# Patient Record
Sex: Female | Born: 1955 | Race: Black or African American | Hispanic: No | Smoking: Never smoker
Health system: Southern US, Community
[De-identification: ages and names within clinical notes are randomized; demographics above are authoritative.]

## PROBLEM LIST (undated history)

## (undated) DIAGNOSIS — I10 Essential (primary) hypertension: Secondary | ICD-10-CM

## (undated) DIAGNOSIS — IMO0002 Reserved for concepts with insufficient information to code with codable children: Secondary | ICD-10-CM

## (undated) DIAGNOSIS — M329 Systemic lupus erythematosus, unspecified: Secondary | ICD-10-CM

## (undated) DIAGNOSIS — M069 Rheumatoid arthritis, unspecified: Secondary | ICD-10-CM

## (undated) DIAGNOSIS — I38 Endocarditis, valve unspecified: Secondary | ICD-10-CM

## (undated) DIAGNOSIS — E7800 Pure hypercholesterolemia, unspecified: Secondary | ICD-10-CM

## (undated) DIAGNOSIS — F329 Major depressive disorder, single episode, unspecified: Secondary | ICD-10-CM

## (undated) DIAGNOSIS — R569 Unspecified convulsions: Secondary | ICD-10-CM

## (undated) DIAGNOSIS — F32A Depression, unspecified: Secondary | ICD-10-CM

## (undated) HISTORY — DX: Unspecified convulsions: R56.9

## (undated) HISTORY — DX: Systemic lupus erythematosus, unspecified: M32.9

## (undated) HISTORY — DX: Major depressive disorder, single episode, unspecified: F32.9

## (undated) HISTORY — DX: Depression, unspecified: F32.A

## (undated) HISTORY — PX: CHOLECYSTECTOMY: SHX55

## (undated) HISTORY — PX: BREAST BIOPSY: SHX20

## (undated) HISTORY — DX: Pure hypercholesterolemia, unspecified: E78.00

## (undated) HISTORY — DX: Rheumatoid arthritis, unspecified: M06.9

## (undated) HISTORY — DX: Essential (primary) hypertension: I10

## (undated) HISTORY — DX: Endocarditis, valve unspecified: I38

## (undated) HISTORY — DX: Reserved for concepts with insufficient information to code with codable children: IMO0002

---

## 2015-06-27 ENCOUNTER — Other Ambulatory Visit: Payer: Self-pay | Admitting: Family Medicine

## 2015-06-27 DIAGNOSIS — R109 Unspecified abdominal pain: Secondary | ICD-10-CM

## 2015-07-04 ENCOUNTER — Telehealth (HOSPITAL_COMMUNITY): Payer: Self-pay | Admitting: *Deleted

## 2015-07-09 ENCOUNTER — Telehealth (HOSPITAL_COMMUNITY): Payer: Self-pay | Admitting: *Deleted

## 2015-07-17 ENCOUNTER — Ambulatory Visit
Admission: RE | Admit: 2015-07-17 | Discharge: 2015-07-17 | Disposition: A | Discharge status: Home / Self Care | Payer: Medicare Other | Source: Ambulatory Visit | Attending: Family Medicine | Admitting: Family Medicine

## 2015-07-17 DIAGNOSIS — R109 Unspecified abdominal pain: Secondary | ICD-10-CM

## 2015-07-17 MED ORDER — IOPAMIDOL (ISOVUE-300) INJECTION 61%
100.0000 mL | Freq: Once | INTRAVENOUS | Status: AC | PRN
  Administered 2015-07-17: 100 mL via INTRAVENOUS

## 2015-07-23 ENCOUNTER — Other Ambulatory Visit: Payer: Self-pay | Admitting: Family Medicine

## 2015-07-24 ENCOUNTER — Other Ambulatory Visit: Payer: Self-pay | Admitting: Family Medicine

## 2015-07-24 DIAGNOSIS — K869 Disease of pancreas, unspecified: Secondary | ICD-10-CM

## 2015-07-24 DIAGNOSIS — K769 Liver disease, unspecified: Secondary | ICD-10-CM

## 2015-07-25 ENCOUNTER — Other Ambulatory Visit (HOSPITAL_COMMUNITY): Payer: Self-pay | Admitting: Family Medicine

## 2015-07-25 DIAGNOSIS — Z8249 Family history of ischemic heart disease and other diseases of the circulatory system: Secondary | ICD-10-CM

## 2015-07-30 ENCOUNTER — Ambulatory Visit: Payer: Self-pay | Admitting: Neurology

## 2015-08-01 ENCOUNTER — Other Ambulatory Visit: Payer: Self-pay | Admitting: Physician Assistant

## 2015-08-01 DIAGNOSIS — Z7952 Long term (current) use of systemic steroids: Secondary | ICD-10-CM

## 2015-08-05 ENCOUNTER — Ambulatory Visit
Admission: RE | Admit: 2015-08-05 | Discharge: 2015-08-05 | Disposition: A | Discharge status: Home / Self Care | Payer: Medicare Other | Source: Ambulatory Visit | Attending: Family Medicine | Admitting: Family Medicine

## 2015-08-05 DIAGNOSIS — K869 Disease of pancreas, unspecified: Secondary | ICD-10-CM

## 2015-08-05 DIAGNOSIS — K769 Liver disease, unspecified: Secondary | ICD-10-CM

## 2015-08-08 ENCOUNTER — Ambulatory Visit (HOSPITAL_COMMUNITY): Payer: PPO | Attending: Cardiology

## 2015-08-08 ENCOUNTER — Other Ambulatory Visit: Payer: Self-pay

## 2015-08-08 DIAGNOSIS — Z8249 Family history of ischemic heart disease and other diseases of the circulatory system: Secondary | ICD-10-CM | POA: Diagnosis not present

## 2015-08-08 DIAGNOSIS — I340 Nonrheumatic mitral (valve) insufficiency: Secondary | ICD-10-CM | POA: Insufficient documentation

## 2015-08-08 DIAGNOSIS — E785 Hyperlipidemia, unspecified: Secondary | ICD-10-CM | POA: Diagnosis not present

## 2015-08-08 DIAGNOSIS — I119 Hypertensive heart disease without heart failure: Secondary | ICD-10-CM | POA: Insufficient documentation

## 2015-08-13 ENCOUNTER — Ambulatory Visit
Admission: RE | Admit: 2015-08-13 | Discharge: 2015-08-13 | Disposition: A | Discharge status: Home / Self Care | Payer: PPO | Source: Ambulatory Visit | Attending: Physician Assistant | Admitting: Physician Assistant

## 2015-08-13 DIAGNOSIS — Z780 Asymptomatic menopausal state: Secondary | ICD-10-CM | POA: Diagnosis not present

## 2015-08-13 DIAGNOSIS — Z7952 Long term (current) use of systemic steroids: Secondary | ICD-10-CM

## 2015-08-19 ENCOUNTER — Ambulatory Visit (INDEPENDENT_AMBULATORY_CARE_PROVIDER_SITE_OTHER): Payer: PPO | Admitting: Neurology

## 2015-08-19 ENCOUNTER — Encounter: Payer: Self-pay | Admitting: Neurology

## 2015-08-19 VITALS — BP 110/70 | HR 70 | Ht 62.0 in | Wt 159.0 lb

## 2015-08-19 DIAGNOSIS — R413 Other amnesia: Secondary | ICD-10-CM

## 2015-08-19 DIAGNOSIS — G40009 Localization-related (focal) (partial) idiopathic epilepsy and epileptic syndromes with seizures of localized onset, not intractable, without status epilepticus: Secondary | ICD-10-CM

## 2015-08-19 NOTE — Progress Notes (Signed)
NEUROLOGY CONSULTATION NOTE  Kerri Mccoy MRN: [id number] DOB: May 26, 1956  Referring provider: Dr. Maurice Small Primary care provider: Dr. Maurice Small  Reason for consult:  Seizures, memory loss  Dear Dr Justin Mend:  Thank you for your kind referral of Kerri Mccoy for consultation of the above symptoms. Although her history is well known to you, please allow me to reiterate it for the purpose of our medical record. The patient was accompanied to the clinic by her nephew who also provides collateral information. Records and images were personally reviewed where available.  HISTORY OF PRESENT ILLNESS: This is a pleasant 60 year old right-handed woman with a history of discoid lupus, rheumatoid arthritis, hyperlipidemia, hypertension, presenting to establish care for seizures and memory loss. She reports seizures started in 1969 when she was diagnosed with lupus. She denies any convulsive seizures. She describes her seizures as "phasing out," she would start feeling really strange, like something is traveling up her body, then she becomes unresponsive and stares. She needs to take a nap after, no focal weakness. She denies any olfactory/gustatory hallucinations or focal paresthesias. No tongue bite or incontinence. She was started on an unrecalled seizure medication in 1972, and reports being switched to oxcarbazepine around 2 years ago. She felt drowsy on 300mg  BID and takes on 300mg  qhs. 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 Once she stopped working, she reports the seizures have decreased in frequency, she can go several months without seizures, last seizure was 3 months ago. She had been seeing neurologist Dr. Posey Pronto in [place], and previously another neurologist who told her that her EEG had shown seizure activity and switched her to oxcarbazepine. She reports having a brain scan, results unavailable for review. She started  having body jerking around 1-1/2 yrs ago that mostly occurs when she gets surprised. There was significant improvement with starting Paroxetine by Dr. Posey Pronto almost a year ago. She had a few body jerks in the office today, initially more at the beginning and towards the end of the visit, none when distracted.   She started having memory changes a couple of years ago. She feels her memory is "pretty messed up," she would ask the same question repeatedly, would not recall where she put things or what she wore yesterday. She occasionally forgets her medications. All her bills are on autopay. She does not drive. Her nephew feels her memory is better than what people say it is, he does notice she repeats herself. He denies any personality changes, and reports she is much better overall since moving to [place] last October 2016. She was prescribed donepezil 10mg  daily by Dr. Posey Pronto, no side effects.  She has occasional dizziness (lightheaded) that resolves upon sitting. She has some neck pain due to degenerative disc disease. She has occasional numbness in her fingers. She denies any headaches, diplopia, dysarthria, dysphagia, back pain, focal numbness/tingling/weakness, bowel/bladder dysfunction. Her nephew reports an incident 2 months ago where "she just freaked and started screaming," he is unsure if this was a seizure but he denies any staring/unresponsive episodes.   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 There is no history of febrile convulsions, CNS infections such as meningitis/encephalitis, significant traumatic brain injury, neurosurgical procedures, or family history of seizures.  PAST MEDICAL HISTORY: Past Medical History  Diagnosis Date  . Hypertension   . Hypercholesteremia   .  Seizure ([place])   . Depression   . Lupus ([place])   . Rheumatoid arthritis ([place])   . Heart valve disorder     PAST SURGICAL HISTORY: Past  Surgical History  Procedure Laterality Date  . Cesarean section      x 2  . Cholecystectomy    . Breast biopsy Right     MEDICATIONS: No current outpatient prescriptions on file prior to visit.   No current facility-administered medications on file prior to visit.    ALLERGIES: Allergies  Allergen Reactions  . Codeine Rash  . Penicillins Rash    FAMILY HISTORY: No family history on file.  SOCIAL HISTORY: Social History   Social History  . Marital Status: Divorced    Spouse Name: N/A  . Number of Children: N/A  . Years of Education: N/A   Occupational History  . Not on file.   Social History Main Topics  . Smoking status: Never Smoker   . Smokeless tobacco: Not on file  . Alcohol Use: 0.0 oz/week    0 Standard drinks or equivalent per week     Comment: once every 4 months  . Drug Use: No  . Sexual Activity: Not on file   Other Topics Concern  . Not on file   Social History Narrative  . No narrative on file    REVIEW OF SYSTEMS: Constitutional: No fevers, chills, or sweats, no generalized fatigue, change in appetite Eyes: No visual changes, double vision, eye pain Ear, nose and throat: No hearing loss, ear pain, nasal congestion, sore throat Cardiovascular: No chest pain, palpitations Respiratory:  No shortness of breath at rest or with exertion, wheezes GastrointestinaI: No nausea, vomiting, diarrhea, abdominal pain, fecal incontinence Genitourinary:  No dysuria, urinary retention or frequency Musculoskeletal:  +neck pain,no back pain Integumentary: No rash, pruritus, skin lesions Neurological: as above Psychiatric: No depression, insomnia, anxiety Endocrine: No palpitations, fatigue, diaphoresis, mood swings, change in appetite, change in weight, increased thirst Hematologic/Lymphatic:  No anemia, purpura, petechiae. Allergic/Immunologic: no itchy/runny eyes, nasal congestion, recent allergic reactions, rashes  PHYSICAL EXAM: Filed Vitals:    08/19/15 0846  BP: 110/70  Pulse: 70   General: No acute distress Head:  Normocephalic/atraumatic Eyes: Fundoscopic exam shows bilateral sharp discs, no vessel changes, exudates, or hemorrhages Neck: supple, no paraspinal tenderness, full range of motion Back: No paraspinal tenderness Heart: regular rate and rhythm Lungs: Clear to auscultation bilaterally. Vascular: No carotid bruits. Skin/Extremities: No rash, no edema Neurological Exam: Mental status: alert and oriented to person, place, and time, no dysarthria or aphasia, Fund of knowledge is appropriate.  Recent and remote memory are intact.  Attention and concentration are normal.    Able to name objects and repeat phrases. Cranial nerves: CN I: not tested CN II: pupils equal, round and reactive to light, visual fields intact, fundi unremarkable. CN III, IV, VI:  full range of motion, no nystagmus, no ptosis CN V: facial sensation intact CN VII: upper and lower face symmetric CN VIII: hearing intact to finger rub CN IX, X: gag intact, uvula midline CN XI: sternocleidomastoid and trapezius muscles intact CN XII: tongue midline Bulk & Tone: normal, no fasciculations. Motor: 5/5 throughout with no pronator drift. Sensation: intact to light touch, cold, pin, vibration and joint position sense.  No extinction to double simultaneous stimulation.  Romberg test negative Deep Tendon Reflexes: +2 throughout, no ankle clonus Plantar responses: downgoing bilaterally Cerebellar: no incoordination on finger to nose, heel to shin. 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 She continues to have seizures every few months, but is on a low dose of  oxcarbazepine 300mg  qhs. Records from her previous neurologist will be requested for review. Continue current dose for now, a 24-hour EEG will be ordered to further classify her seizures. She is noted to have body jerking in the office, that was distractible, and seemed to occur more with startle. She also reports memory loss and has been prescribed donepezil. The 24-hour EEG will be helpful to assess for subclinical seizures that can also potentially cause memory issues.  driving laws were discussed with the patient, and she knows to stop driving after a seizure, until 6 months seizure-free. She does not drive. She will follow-up in 6 months and knows to call for any changes.   Thank you for allowing me to participate in the care of this patient. Please do not hesitate to call for any questions or concerns.   Ellouise Newer, M.D.  CC: Dr. Justin Mend

## 2015-08-19 NOTE — Patient Instructions (Addendum)
1. Schedule 24-hour EEG 2. Continue oxcarbazepine 300mg  daily 3. Continue donepezil 10mg  daily 4. Records from your previous neurologist will be requested for review ((Dr. Posey Pronto, [phone], [address] [place], [place]) 5. Follow-up in 6 months  Seizure Precautions: 1. If medication has been prescribed for you to prevent seizures, take it exactly as directed.  Do not stop taking the medicine without talking to your doctor first, even if you have not had a seizure in a long time.   2. Avoid activities in which a seizure would cause danger to yourself or to others.  Don't operate dangerous machinery, swim alone, or climb in high or dangerous places, such as on ladders, roofs, or girders.  Do not drive unless your doctor says you may.  3. If you have any warning that you may have a seizure, lay down in a safe place where you can't hurt yourself.    4.  No driving for 6 months from last seizure, as per [facility].   Please refer to the following link on the [place] website for more information: http://[email or web address]/[email or web address]/driving/drivingu.cfm   5.  Maintain good sleep hygiene. Avoid alcohol.  6.  Contact your doctor if you have any problems that may be related to the medicine you are taking.  7.  Call 911 and bring the patient back to the ED if:        A.  The seizure lasts longer than 5 minutes.       B.  The patient doesn't awaken shortly after the seizure  C.  The patient has new problems such as difficulty seeing, speaking or moving  D.  The patient was injured during the seizure  E.  The patient has a temperature over 102 F (39C)  F.  The patient vomited and now is having trouble breathing

## 2015-08-26 ENCOUNTER — Ambulatory Visit (INDEPENDENT_AMBULATORY_CARE_PROVIDER_SITE_OTHER): Payer: PPO | Admitting: Neurology

## 2015-08-26 DIAGNOSIS — G40009 Localization-related (focal) (partial) idiopathic epilepsy and epileptic syndromes with seizures of localized onset, not intractable, without status epilepticus: Secondary | ICD-10-CM | POA: Diagnosis not present

## 2015-08-29 DIAGNOSIS — Z79899 Other long term (current) drug therapy: Secondary | ICD-10-CM | POA: Diagnosis not present

## 2015-08-29 DIAGNOSIS — M329 Systemic lupus erythematosus, unspecified: Secondary | ICD-10-CM | POA: Diagnosis not present

## 2015-09-02 ENCOUNTER — Encounter: Payer: Self-pay | Admitting: Neurology

## 2015-09-02 ENCOUNTER — Ambulatory Visit (INDEPENDENT_AMBULATORY_CARE_PROVIDER_SITE_OTHER): Payer: PPO | Admitting: Neurology

## 2015-09-02 VITALS — BP 118/76 | HR 71 | Resp 16 | Ht 62.0 in | Wt 159.0 lb

## 2015-09-02 DIAGNOSIS — G40009 Localization-related (focal) (partial) idiopathic epilepsy and epileptic syndromes with seizures of localized onset, not intractable, without status epilepticus: Secondary | ICD-10-CM

## 2015-09-02 DIAGNOSIS — R413 Other amnesia: Secondary | ICD-10-CM

## 2015-09-02 NOTE — Procedures (Signed)
ELECTROENCEPHALOGRAM REPORT  Dates of Recording: 08/26/2015 to 08/27/2015  Patient's Name: Kerri Mccoy MRN: [id number] Date of Birth: [birth year]  Referring Provider: Dr. 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 She continues to have seizures every few months, as well as memory loss. EEG for classification and assess for subclinical seizures causing memory issues.   Medications: Trileptal, Donepezil, Paxil, Trazodone, Prednisone, Plaquenil  Technical Summary: This is a 24-hour multichannel digital EEG recording measured by the international 10-20 system with electrodes applied with paste and impedances below 5000 ohms performed as portable with EKG monitoring.  The digital EEG was referentially recorded, reformatted, and digitally filtered in a variety of bipolar and referential montages for optimal display.    DESCRIPTION OF RECORDING: During maximal wakefulness, the background activity consisted of a symmetric 9.5 Hz posterior dominant rhythm which was reactive to eye opening.  There were no epileptiform discharges or focal slowing seen in wakefulness.  During the recording, the patient progresses through wakefulness, drowsiness, and Stage 2 sleep.  Again, there were no epileptiform discharges seen.  Events: On 03/21/at 1137 hours, she reports a slight headache on the bottom right of her head. Electrographically, there were no EEG or EKG changes seen.  On 03/21 at 1158 hours, she reports a leg jerk. Electrographically, there were no EEG or EKG changes seen.  On 03/21 at 1225 hours, she reports a twitch. Electrographically, there were no EEG or EKG changes seen.  On 03/21 at 1853 hours, she reports a twitch. Electrographically, there were no EEG or EKG changes seen.  On 03/21 at 1941 hours, she reports a twitch. Electrographically, there were no EEG or EKG  changes seen.  There were no electrographic seizures seen.  EKG lead was unremarkable.  IMPRESSION: This 24-hour ambulatory EEG study is normal.    CLINICAL CORRELATION: A normal EEG does not exclude a clinical diagnosis of epilepsy. Typical events were not captured. Body twitches and leg jerk, headache, did not show any electrographic correlate. There were no electrographic seizures seen.  If further clinical questions remain, inpatient video EEG monitoring may be helpful.   Ellouise Newer, M.D.

## 2015-09-02 NOTE — Patient Instructions (Signed)
You look great! Continue all your medications. Follow-up in 6 months, call for any changes.  Seizure Precautions: 1. If medication has been prescribed for you to prevent seizures, take it exactly as directed.  Do not stop taking the medicine without talking to your doctor first, even if you have not had a seizure in a long time.   2. Avoid activities in which a seizure would cause danger to yourself or to others.  Don't operate dangerous machinery, swim alone, or climb in high or dangerous places, such as on ladders, roofs, or girders.  Do not drive unless your doctor says you may.  3. If you have any warning that you may have a seizure, lay down in a safe place where you can't hurt yourself.    4.  No driving for 6 months from last seizure, as per [facility].   Please refer to the following link on the [place] website for more information: http://[email or web address]/[email or web address]/driving/drivingu.cfm   5.  Maintain good sleep hygiene. Avoid alcohol.  6.  Contact your doctor if you have any problems that may be related to the medicine you are taking.  7.  Call 911 and bring the patient back to the ED if:        A.  The seizure lasts longer than 5 minutes.       B.  The patient doesn't awaken shortly after the seizure  C.  The patient has new problems such as difficulty seeing, speaking or moving  D.  The patient was injured during the seizure  E.  The patient has a temperature over 102 F (39C)  F.  The patient vomited and now is having trouble breathing

## 2015-09-02 NOTE — Progress Notes (Signed)
NEUROLOGY FOLLOW UP OFFICE NOTE  Kerri Mccoy [id number]  HISTORY OF PRESENT ILLNESS: I had the pleasure of seeing Kerri Mccoy in follow-up in the neurology clinic on 09/02/2015.  The patient was last seen 3 weeks ago to establish care for seizures and memory loss. She is again accompanied by her nephew who helps supplement the history today.  Records and images were personally reviewed where available.  Her 24-hour EEG was normal, she had several body twitches, leg jerk, headache, with no associated epileptiform correlate. She denies any new changes since her last visit. She reports she is not as "jumpy" today, and has been calm and relaxed since her move to [place].   HPI: This is a pleasant 60 yo RH woman with a history of discoid lupus, rheumatoid arthritis, hyperlipidemia, hypertension, with a history of seizures and memory loss. She reports seizures started in 1969 when she was diagnosed with lupus. She denies any convulsive seizures. She describes her seizures as "phasing out," she would start feeling really strange, like something is traveling up her body, then she becomes unresponsive and stares. She needs to take a nap after, no focal weakness. She denies any olfactory/gustatory hallucinations or focal paresthesias. No tongue bite or incontinence. She was started on an unrecalled seizure medication in 1972, and reports being switched to oxcarbazepine around 2 years ago. She felt drowsy on 300mg  BID and takes on 300mg  qhs. 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 Once she stopped working, she reports the seizures have decreased in frequency, she can go several months without seizures, last seizure was 3 months ago. She had been seeing neurologist Dr. Posey Pronto in [place], and previously another neurologist who told her that her EEG had shown seizure activity and switched her to oxcarbazepine. She reports having a brain  scan, results unavailable for review. She started having body jerking around 1-1/2 yrs ago that mostly occurs when she gets surprised. There was significant improvement with starting Paroxetine by Dr. Posey Pronto almost a year ago. She had a few body jerks in the office today, initially more at the beginning and towards the end of the visit, none when distracted.   She started having memory changes a couple of years ago. She feels her memory is "pretty messed up," she would ask the same question repeatedly, would not recall where she put things or what she wore yesterday. She occasionally forgets her medications. All her bills are on autopay. She does not drive. Her nephew feels her memory is better than what people say it is, he does notice she repeats herself. He denies any personality changes, and reports she is much better overall since moving to [place] last October 2016. She was prescribed donepezil 10mg  daily by Dr. Posey Pronto, no side effects.  She has occasional dizziness (lightheaded) that resolves upon sitting. She has some neck pain due to degenerative disc disease. She has occasional numbness in her fingers. She denies any headaches, diplopia, dysarthria, dysphagia, back pain, focal numbness/tingling/weakness, bowel/bladder dysfunction. Her nephew reports an incident 2 months ago where "she just freaked and started screaming," he is unsure if this was a seizure but he denies any staring/unresponsive episodes.   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 There is no history of febrile convulsions, CNS infections such as meningitis/encephalitis, significant traumatic brain injury, neurosurgical procedures, or family history of  seizures.  PAST MEDICAL HISTORY: Past Medical History  Diagnosis Date  . Hypertension   . Hypercholesteremia   . Seizure ([place])   . Depression   . Lupus ([place])   . Rheumatoid arthritis ([place])   . Heart valve  disorder     MEDICATIONS: Current Outpatient Prescriptions on File Prior to Visit  Medication Sig Dispense Refill  . benazepril (LOTENSIN) 20 MG tablet Take 20 mg by mouth daily.  3  . Cholecalciferol (VITAMIN D-3) 1000 units CAPS Take 1,000 Units by mouth daily.    Marland Kitchen donepezil (ARICEPT) 10 MG tablet Take 10 mg by mouth at bedtime.    . ferrous sulfate 325 (65 FE) MG tablet Take 325 mg by mouth daily with breakfast.    . hydrochlorothiazide (HYDRODIURIL) 25 MG tablet Take 25 mg by mouth daily.    . hydroxychloroquine (PLAQUENIL) 200 MG tablet Take 200 mg by mouth 2 (two) times daily with a meal.  0  . ibuprofen (ADVIL,MOTRIN) 800 MG tablet Take 800 mg by mouth as needed.    . Oxcarbazepine (TRILEPTAL) 300 MG tablet Take 300 mg by mouth 2 (two) times daily.   3  . PARoxetine (PAXIL) 10 MG tablet TAKE 1 TABLET IN THE MORNING ONCE A DAY ORALLY 30 DAYS  5  . predniSONE (DELTASONE) 5 MG tablet TAKE 1 TABLET ONCE A DAY ( LUPUS) ORALLY 30 DAYS  5  . simvastatin (ZOCOR) 20 MG tablet Take 20 mg by mouth every evening.  3  . traZODone (DESYREL) 50 MG tablet TAKE 1 TABLET AT BEDTIME AS NEEDED ONCE A DAY ORALLY 30 DAYS  5   No current facility-administered medications on file prior to visit.    ALLERGIES: Allergies  Allergen Reactions  . Codeine Rash  . Penicillins Rash    FAMILY HISTORY: No family history on file.  SOCIAL HISTORY: Social History   Social History  . Marital Status: Divorced    Spouse Name: N/A  . Number of Children: N/A  . Years of Education: N/A   Occupational History  . Not on file.   Social History Main Topics  . Smoking status: Never Smoker   . Smokeless tobacco: Not on file  . Alcohol Use: 0.0 oz/week    0 Standard drinks or equivalent per week     Comment: once every 4 months  . Drug Use: No  . Sexual Activity: Not on file   Other Topics Concern  . Not on file   Social History Narrative  . No narrative on file    REVIEW OF  SYSTEMS: Constitutional: No fevers, chills, or sweats, no generalized fatigue, change in appetite Eyes: No visual changes, double vision, eye pain Ear, nose and throat: No hearing loss, ear pain, nasal congestion, sore throat Cardiovascular: No chest pain, palpitations Respiratory:  No shortness of breath at rest or with exertion, wheezes GastrointestinaI: No nausea, vomiting, diarrhea, abdominal pain, fecal incontinence Genitourinary:  No dysuria, urinary retention or frequency Musculoskeletal:  No neck pain, back pain Integumentary: No rash, pruritus, skin lesions Neurological: as above Psychiatric: No depression, insomnia, anxiety Endocrine: No palpitations, fatigue, diaphoresis, mood swings, change in appetite, change in weight, increased thirst Hematologic/Lymphatic:  No anemia, purpura, petechiae. Allergic/Immunologic: no itchy/runny eyes, nasal congestion, recent allergic reactions, rashes  PHYSICAL EXAM: Filed Vitals:   09/02/15 0926  BP: 118/76  Pulse: 71  Resp: 16   General: No acute distress Head:  Normocephalic/atraumatic Neck: supple, no paraspinal tenderness, full range of motion Heart:  Regular  rate and rhythm Lungs:  Clear to auscultation bilaterally Back: No paraspinal tenderness Skin/Extremities: No rash, no edema Neurological Exam: alert and oriented to person, place, and time. No aphasia or dysarthria. Fund of knowledge is appropriate.  Recent and remote memory are intact.  Attention and concentration are normal.    Able to name objects and repeat phrases. Cranial nerves: Pupils equal, round, reactive to light. Extraocular movements intact with no nystagmus. Visual fields full. Facial sensation intact. No facial asymmetry. Tongue, uvula, palate midline.  Motor: Bulk and tone normal, muscle strength 5/5 throughout with no pronator drift.  Sensation to light touch intact.  No extinction to double simultaneous stimulation.  Deep tendon reflexes 2+ throughout, toes  downgoing.  Finger to nose testing intact.  Gait narrow-based and steady, able to tandem walk adequately.  Romberg negative.  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 She continues to have seizures every few months, but is on a low dose of oxcarbazepine 300mg  qhs. She was also reporting memory changes and has been prescribed Donepezil by her previous neurologist. Her 24-hour EEG is normal. Episodes of body twitches and leg jerk did not show any epileptiform correlate. We have agreed to continue low dose oxcarbazepine at this time. She is doing much better with less stress now that she is living in [place]. She did not tolerate oxcarbazepine 300mg  BID in the past, if seizures recur, we will increase dose slightly to 150mg  in AM, 300mg  in PM. We discussed the body jerks that are non-epileptic, she did report a decrease in these with her previous neurologist starting an SSRI. She does not drive and is aware of  driving laws to stop driving after a seizure, until 6 months seizure-free. She will follow-up in 6 months and knows to call for any changes  Thank you for allowing me to participate in her care.  Please do not hesitate to call for any questions or concerns.  The duration of this appointment visit was 15 minutes of face-to-face time with the patient.  Greater than 50% of this time was spent in counseling, explanation of diagnosis, planning of further management, and coordination of care.   Kerri Mccoy, M.D.   CC: Dr. Justin Mend

## 2015-09-05 DIAGNOSIS — I10 Essential (primary) hypertension: Secondary | ICD-10-CM | POA: Diagnosis not present

## 2015-09-22 ENCOUNTER — Telehealth: Payer: Self-pay | Admitting: Neurology

## 2015-09-22 NOTE — Telephone Encounter (Signed)
Records from her neurologist in Dr. Criss Alvine in [place], [place] were reviewed.   For memory loss, she had a PET Brain scan which was normal, no evidence of Alzheimer's disease or frontotemporal dementia. She was referred for Neuropsychological eval in 2015.

## 2015-10-29 DIAGNOSIS — M0579 Rheumatoid arthritis with rheumatoid factor of multiple sites without organ or systems involvement: Secondary | ICD-10-CM | POA: Diagnosis not present

## 2015-10-29 DIAGNOSIS — M2550 Pain in unspecified joint: Secondary | ICD-10-CM | POA: Diagnosis not present

## 2015-10-29 DIAGNOSIS — M329 Systemic lupus erythematosus, unspecified: Secondary | ICD-10-CM | POA: Diagnosis not present

## 2015-10-29 DIAGNOSIS — Z79899 Other long term (current) drug therapy: Secondary | ICD-10-CM | POA: Diagnosis not present

## 2015-12-20 ENCOUNTER — Telehealth: Payer: Self-pay | Admitting: *Deleted

## 2015-12-20 NOTE — Telephone Encounter (Signed)
Opened chart in error. Wrong pt 

## 2015-12-30 DIAGNOSIS — G5601 Carpal tunnel syndrome, right upper limb: Secondary | ICD-10-CM | POA: Diagnosis not present

## 2016-01-07 DIAGNOSIS — Z0000 Encounter for general adult medical examination without abnormal findings: Secondary | ICD-10-CM | POA: Diagnosis not present

## 2016-01-07 DIAGNOSIS — E785 Hyperlipidemia, unspecified: Secondary | ICD-10-CM | POA: Diagnosis not present

## 2016-01-07 DIAGNOSIS — K2900 Acute gastritis without bleeding: Secondary | ICD-10-CM | POA: Diagnosis not present

## 2016-01-07 DIAGNOSIS — I10 Essential (primary) hypertension: Secondary | ICD-10-CM | POA: Diagnosis not present

## 2016-01-07 DIAGNOSIS — Z8041 Family history of malignant neoplasm of ovary: Secondary | ICD-10-CM | POA: Diagnosis not present

## 2016-01-07 DIAGNOSIS — F330 Major depressive disorder, recurrent, mild: Secondary | ICD-10-CM | POA: Diagnosis not present

## 2016-01-15 DIAGNOSIS — D259 Leiomyoma of uterus, unspecified: Secondary | ICD-10-CM | POA: Diagnosis not present

## 2016-01-15 DIAGNOSIS — Z8041 Family history of malignant neoplasm of ovary: Secondary | ICD-10-CM | POA: Diagnosis not present

## 2016-01-15 DIAGNOSIS — R102 Pelvic and perineal pain: Secondary | ICD-10-CM | POA: Diagnosis not present

## 2016-02-19 ENCOUNTER — Ambulatory Visit: Payer: PPO | Admitting: Neurology

## 2016-02-19 DIAGNOSIS — Z029 Encounter for administrative examinations, unspecified: Secondary | ICD-10-CM

## 2016-04-28 DIAGNOSIS — Z79899 Other long term (current) drug therapy: Secondary | ICD-10-CM | POA: Diagnosis not present

## 2016-04-28 DIAGNOSIS — M0579 Rheumatoid arthritis with rheumatoid factor of multiple sites without organ or systems involvement: Secondary | ICD-10-CM | POA: Diagnosis not present

## 2016-04-28 DIAGNOSIS — M329 Systemic lupus erythematosus, unspecified: Secondary | ICD-10-CM | POA: Diagnosis not present

## 2016-04-28 DIAGNOSIS — M2550 Pain in unspecified joint: Secondary | ICD-10-CM | POA: Diagnosis not present

## 2016-05-29 ENCOUNTER — Emergency Department
Admission: EM | Admit: 2016-05-29 | Discharge: 2016-05-30 | Disposition: A | Discharge status: Home / Self Care | Payer: PPO | Attending: Emergency Medicine | Admitting: Emergency Medicine

## 2016-05-29 ENCOUNTER — Emergency Department: Payer: PPO

## 2016-05-29 ENCOUNTER — Encounter: Payer: Self-pay | Admitting: Emergency Medicine

## 2016-05-29 DIAGNOSIS — F419 Anxiety disorder, unspecified: Secondary | ICD-10-CM | POA: Diagnosis not present

## 2016-05-29 DIAGNOSIS — Z79899 Other long term (current) drug therapy: Secondary | ICD-10-CM | POA: Diagnosis not present

## 2016-05-29 DIAGNOSIS — R0602 Shortness of breath: Secondary | ICD-10-CM

## 2016-05-29 DIAGNOSIS — Z791 Long term (current) use of non-steroidal anti-inflammatories (NSAID): Secondary | ICD-10-CM | POA: Insufficient documentation

## 2016-05-29 DIAGNOSIS — R918 Other nonspecific abnormal finding of lung field: Secondary | ICD-10-CM | POA: Diagnosis not present

## 2016-05-29 DIAGNOSIS — R079 Chest pain, unspecified: Secondary | ICD-10-CM | POA: Diagnosis not present

## 2016-05-29 DIAGNOSIS — I10 Essential (primary) hypertension: Secondary | ICD-10-CM | POA: Diagnosis not present

## 2016-05-29 LAB — BASIC METABOLIC PANEL
ANION GAP: 8 (ref 5–15)
BUN: 16 mg/dL (ref 6–20)
CHLORIDE: 107 mmol/L (ref 101–111)
CO2: 26 mmol/L (ref 22–32)
Calcium: 9.1 mg/dL (ref 8.9–10.3)
Creatinine, Ser: 0.93 mg/dL (ref 0.44–1.00)
Glucose, Bld: 81 mg/dL (ref 65–99)
POTASSIUM: 3.7 mmol/L (ref 3.5–5.1)
SODIUM: 141 mmol/L (ref 135–145)

## 2016-05-29 LAB — CBC WITH DIFFERENTIAL/PLATELET
BASOS ABS: 0 10*3/uL (ref 0–0.1)
Basophils Relative: 1 %
EOS ABS: 0.1 10*3/uL (ref 0–0.7)
EOS PCT: 1 %
HCT: 38.1 % (ref 35.0–47.0)
HEMOGLOBIN: 12.6 g/dL (ref 12.0–16.0)
LYMPHS ABS: 1.1 10*3/uL (ref 1.0–3.6)
Lymphocytes Relative: 26 %
MCH: 27.3 pg (ref 26.0–34.0)
MCHC: 33.2 g/dL (ref 32.0–36.0)
MCV: 82.4 fL (ref 80.0–100.0)
Monocytes Absolute: 0.7 10*3/uL (ref 0.2–0.9)
Monocytes Relative: 15 %
NEUTROS PCT: 57 %
Neutro Abs: 2.4 10*3/uL (ref 1.4–6.5)
PLATELETS: 196 10*3/uL (ref 150–440)
RBC: 4.62 MIL/uL (ref 3.80–5.20)
RDW: 14.4 % (ref 11.5–14.5)
WBC: 4.3 10*3/uL (ref 3.6–11.0)

## 2016-05-29 LAB — BRAIN NATRIURETIC PEPTIDE: B NATRIURETIC PEPTIDE 5: 68 pg/mL (ref 0.0–100.0)

## 2016-05-29 NOTE — ED Notes (Signed)
Pt presents to ED 05 c/o shortness of breath and pressure in the chest area on and off for the last 2 weeks; pt states history of hypertension and taking medication as prescribed; pt denies any lightheadedness, dizziness, nausea, vomiting, or diarrhea. Pt is alert and oriented x4 and able to speak in complete sentences

## 2016-05-29 NOTE — ED Provider Notes (Signed)
[facility] Emergency Department Provider Note        Time seen: ----------------------------------------- 11:37 PM on 05/29/2016 -----------------------------------------    I have reviewed the triage vital signs and the nursing notes.   HISTORY  Chief Complaint Shortness of Breath    HPI Kerri Mccoy is a 60 y.o. female who presents to the ER for shortness of breath for several weeks which has been worsening. Patient states worse over the last 3 days. She's had some chest tightness as well. She lives with her niece and has been noted to be shaking lightly. Patient reportedly has a history of high blood pressure and takes blood pressure medicines. Family is concerned she is under a lot of stress due to the holidays. She denies any recent illness or changes in her medicine. Walking seems to worsen her symptoms.   Past Medical History:  Diagnosis Date  . Depression   . Heart valve disorder   . Hypercholesteremia   . Hypertension   . Lupus   . Rheumatoid arthritis ([place])   . Seizure [facility])     Patient Active Problem List   Diagnosis Date Noted  . Localization-related (focal) (partial) idiopathic epilepsy and epileptic syndromes with seizures of localized onset, not intractable, without status epilepticus 08/19/2015  . Memory loss 08/19/2015    Past Surgical History:  Procedure Laterality Date  . BREAST BIOPSY Right   . CESAREAN SECTION     x 2  . CHOLECYSTECTOMY      Allergies Codeine and Penicillins  Social History Social History  Substance Use Topics  . Smoking status: Never Smoker  . Smokeless tobacco: Never Used  . Alcohol use 0.0 oz/week     Comment: once every 4 months    Review of Systems Constitutional: Negative for fever. Cardiovascular: Positive for chest tightness Respiratory: Positive for shortness of breath Gastrointestinal: Negative for abdominal pain, vomiting and diarrhea. Genitourinary: Negative for  dysuria. Musculoskeletal: Negative for back pain. Skin: Negative for rash. Neurological: Negative for headaches, focal weakness or numbness.  10-point ROS otherwise negative.  ____________________________________________   PHYSICAL EXAM:  VITAL SIGNS: ED Triage Vitals  Enc Vitals Group     BP 05/29/16 2226 (!) 186/102     Pulse Rate 05/29/16 2226 70     Resp 05/29/16 2226 18     Temp 05/29/16 2226 98.3 F (36.8 C)     Temp Source 05/29/16 2226 Oral     SpO2 05/29/16 2226 100 %     Weight 05/29/16 2227 160 lb (72.6 kg)     Height 05/29/16 2227 5\' 1"  (1.549 m)     Head Circumference --      Peak Flow --      Pain Score 05/29/16 2228 0     Pain Loc --      Pain Edu? --      Excl. in [place]? --     Constitutional: Alert and oriented. Well appearing and in no distress. Eyes: Conjunctivae are normal. PERRL. Normal extraocular movements. ENT   Head: Normocephalic and atraumatic.   Nose: No congestion/rhinnorhea.   Mouth/Throat: Mucous membranes are moist.   Neck: No stridor. Cardiovascular: Normal rate, regular rhythm. No murmurs, rubs, or gallops. Respiratory: Normal respiratory effort without tachypnea nor retractions. Breath sounds are clear and equal bilaterally. No wheezes/rales/rhonchi. Gastrointestinal: Soft and nontender. Normal bowel sounds Musculoskeletal: Nontender with normal range of motion in all extremities. No lower extremity tenderness nor edema. Neurologic:  Normal speech and language. No gross  focal neurologic deficits are appreciated.  Skin:  Skin is warm, dry and intact. No rash noted. Psychiatric: Mood and affect are normal. Speech and behavior are normal.  ____________________________________________  EKG: Interpreted by me.Sinus rhythm rate of 66 bpm, normal PR interval, normal QRS, normal QT, normal axis.  ____________________________________________  ED COURSE:  Pertinent labs & imaging results that were available during my care of the  patient were reviewed by me and considered in my medical decision making (see chart for details). Clinical Course   Patient presents to the ER in no distress, we will assess with labs and imaging.  Procedures ____________________________________________   LABS (pertinent positives/negatives)  Labs Reviewed  CBC WITH DIFFERENTIAL/PLATELET  BASIC METABOLIC PANEL  BRAIN NATRIURETIC PEPTIDE  TROPONIN I    RADIOLOGY Images were viewed by me  Chest x-ray is unremarkable  ____________________________________________  FINAL ASSESSMENT AND PLAN  Chest pain, shortness of breath, Anxiety  Plan: Patient with labs and imaging as dictated above. Patient's no distress, symptoms are consistent with anxiety. Patient does report being under a lot of stress. Blood pressure has improved from 190s over 102 150s without any treatment. I will prescribe a short supply of Ativan to take over the holidays. She is stable for discharge at this time.   Earleen Newport, MD   Note: This dictation was prepared with Dragon dictation. Any transcriptional errors that result from this process are unintentional    Earleen Newport, MD 05/30/16 [phone]

## 2016-05-29 NOTE — ED Triage Notes (Signed)
Pt states that she has felt SOB for several weeks but that it has been getting worse for the last three days. Pt lives with niece who helps with care and has noticed that pt has been shaking lately. Lung sounds are clear and pt is 100% on RA.Pt is in NAD at this time in triage.

## 2016-05-30 LAB — TROPONIN I

## 2016-05-30 MED ORDER — LORAZEPAM 0.5 MG PO TABS: 0.5000 mg | ORAL_TABLET | Freq: Three times a day (TID) | ORAL | 0 refills | Status: AC | PRN

## 2016-05-30 NOTE — ED Notes (Signed)
Pt is in good condition; discharge instructions reviewed, follow up care and home care reviewed, prescription medication reviewed; pt and family verbalized understanding; pt is ambulatory but wanted to use the wheel chair to leave the ED

## 2016-06-10 DIAGNOSIS — R0602 Shortness of breath: Secondary | ICD-10-CM | POA: Diagnosis not present

## 2016-06-10 DIAGNOSIS — I10 Essential (primary) hypertension: Secondary | ICD-10-CM | POA: Diagnosis not present

## 2016-06-10 DIAGNOSIS — F411 Generalized anxiety disorder: Secondary | ICD-10-CM | POA: Diagnosis not present

## 2016-06-17 ENCOUNTER — Telehealth: Payer: Self-pay

## 2016-06-17 DIAGNOSIS — I10 Essential (primary) hypertension: Secondary | ICD-10-CM | POA: Diagnosis not present

## 2016-06-17 NOTE — Telephone Encounter (Signed)
Sent notes to scheduling 

## 2016-06-23 DIAGNOSIS — I10 Essential (primary) hypertension: Secondary | ICD-10-CM | POA: Diagnosis not present

## 2016-06-23 DIAGNOSIS — F411 Generalized anxiety disorder: Secondary | ICD-10-CM | POA: Diagnosis not present

## 2016-06-23 DIAGNOSIS — I5032 Chronic diastolic (congestive) heart failure: Secondary | ICD-10-CM | POA: Diagnosis not present

## 2016-06-24 ENCOUNTER — Ambulatory Visit: Payer: PPO | Admitting: Interventional Cardiology

## 2016-07-07 ENCOUNTER — Encounter (INDEPENDENT_AMBULATORY_CARE_PROVIDER_SITE_OTHER): Payer: Self-pay

## 2016-07-07 ENCOUNTER — Ambulatory Visit (INDEPENDENT_AMBULATORY_CARE_PROVIDER_SITE_OTHER): Payer: PPO | Admitting: Interventional Cardiology

## 2016-07-07 ENCOUNTER — Encounter: Payer: Self-pay | Admitting: Interventional Cardiology

## 2016-07-07 VITALS — BP 104/82 | HR 76 | Ht 61.0 in | Wt 167.0 lb

## 2016-07-07 DIAGNOSIS — R0602 Shortness of breath: Secondary | ICD-10-CM | POA: Diagnosis not present

## 2016-07-07 DIAGNOSIS — I340 Nonrheumatic mitral (valve) insufficiency: Secondary | ICD-10-CM | POA: Diagnosis not present

## 2016-07-07 DIAGNOSIS — E782 Mixed hyperlipidemia: Secondary | ICD-10-CM

## 2016-07-07 DIAGNOSIS — E785 Hyperlipidemia, unspecified: Secondary | ICD-10-CM | POA: Insufficient documentation

## 2016-07-07 DIAGNOSIS — I10 Essential (primary) hypertension: Secondary | ICD-10-CM

## 2016-07-07 NOTE — Progress Notes (Signed)
Cardiology Office Note   Date:  07/07/2016   ID:  Kerri Mccoy, DOB 04-28-1956, MRN [id number]  PCP:  Jonathon Bellows, MD    No chief complaint on file.  SHOB, chest pressure  Wt Readings from Last 3 Encounters:  07/07/16 167 lb (75.8 kg)  05/29/16 160 lb (72.6 kg)  09/02/15 159 lb (72.1 kg)       History of Present Illness: Kerri Mccoy is a 61 y.o. female  Who has had an essentially normal echo with only mild MR, normal LV function. She was seen in the ER a month ago due to [facility].  SHe had a negative w/u and was sent home.  BNP was normal,ECG normal.   About 6 months ago, she was walking 5 miles a day.  She felt [facility] and this activity has stopped.    She is here due to this decrease in her activity. Unclear etiology for shortness of breath. So far, workup has been negative. BNP and chest x-ray were both normal. She does have lupus but this has been under control.  No sx at rest.       Past Medical History:  Diagnosis Date  . Depression   . Heart valve disorder   . Hypercholesteremia   . Hypertension   . Lupus   . Rheumatoid arthritis ([place])   . Seizure [facility])     Past Surgical History:  Procedure Laterality Date  . BREAST BIOPSY Right   . CESAREAN SECTION     x 2  . CHOLECYSTECTOMY       Current Outpatient Prescriptions  Medication Sig Dispense Refill  . Ascorbic Acid (VITAMIN C) 1000 MG tablet 1 tablet    . aspirin (BAYER LOW DOSE) 81 MG EC tablet 1 tablet    . benazepril (LOTENSIN) 20 MG tablet Take 20 mg by mouth daily.  3  . Calcium 500 MG tablet 1 tablet with meals    . Cholecalciferol (VITAMIN D-3) 1000 units CAPS Take 1,000 Units by mouth daily.    Marland Kitchen donepezil (ARICEPT) 10 MG tablet Take 10 mg by mouth at bedtime.    . ferrous sulfate 325 (65 FE) MG tablet Take 325 mg by mouth daily with breakfast.    . hydrochlorothiazide (HYDRODIURIL) 25 MG tablet Take 25 mg by mouth daily.    . hydroxychloroquine (PLAQUENIL) 200 MG tablet Take 200 mg  by mouth 2 (two) times daily with a meal.  0  . ibuprofen (ADVIL,MOTRIN) 800 MG tablet Take 800 mg by mouth as needed.    Marland Kitchen LORazepam (ATIVAN) 0.5 MG tablet Take 1 tablet (0.5 mg total) by mouth every 8 (eight) hours as needed for anxiety. 30 tablet 0  . Oxcarbazepine (TRILEPTAL) 300 MG tablet Take 300 mg by mouth 2 (two) times daily.   3  . PARoxetine (PAXIL) 10 MG tablet TAKE 1 TABLET IN THE MORNING ONCE A DAY ORALLY 30 DAYS  5  . simvastatin (ZOCOR) 20 MG tablet Take 20 mg by mouth every evening.  3   No current facility-administered medications for this visit.     Allergies:   Codeine and Penicillins    Social History:  The patient  reports that she has never smoked. She has never used smokeless tobacco. She reports that she drinks alcohol. She reports that she does not use drugs.   Family History:  The patient's family history includes Heart disease in her father.    ROS:  Please see the history of present  illness.   Otherwise, review of systems are positive for Dyspnea on exertion.   All other systems are reviewed and negative.    PHYSICAL EXAM: VS:  BP 104/82 (BP Location: Left Arm, Patient Position: Sitting, Cuff Size: Normal)   Pulse 76   Ht 5\' 1"  (1.549 m)   Wt 167 lb (75.8 kg)   BMI 31.55 kg/m  , BMI Body mass index is 31.55 kg/m. GEN: Well nourished, well developed, in no acute distress  HEENT: normal  Neck: no JVD, carotid bruits, or masses Cardiac: RRR; no murmurs, rubs, or gallops,no edema  Respiratory:  clear to auscultation bilaterally, normal work of breathing GI: soft, nontender, nondistended, + BS MS: no deformity or atrophy  Skin: warm and dry, no rash Neuro:  Strength and sensation are intact Psych: euthymic mood, full affect   EKG:   The ekg ordered in the emergency room in December 2017 demonstrates normal ECG   Recent Labs: 05/29/2016: B Natriuretic Peptide 68.0; BUN 16; Creatinine, Ser 0.93; Hemoglobin 12.6; Platelets 196; Potassium 3.7; Sodium  141   Lipid Panel No results found for: CHOL, TRIG, HDL, CHOLHDL, VLDL, LDLCALC, LDLDIRECT   Other studies Reviewed: Additional studies/ records that were reviewed today with results demonstrating: Prior ECG reviewed, prior echo reviewed.   ASSESSMENT AND PLAN:  1. Dyspnea on exertion: No significant structural abnormality with her heart. Will check exercise treadmill test. 2. Mitral regurgitation: This is only mild. This is not causing her shortness of breath. No evidence of heart failure. 3. Hypertension: Blood pressure well controlled. 4. Hypercholesterolemia: Continue simvastatin.   Current medicines are reviewed at length with the patient today.  The patient concerns regarding her medicines were addressed.  The following changes have been made:  No change  Labs/ tests ordered today include: ETT  Orders Placed This Encounter  Procedures  . Exercise Tolerance Test    Recommend 150 minutes/week of aerobic exercise Low fat, low carb, high fiber diet recommended  Disposition:   FU in for stress test   Signed, Larae Grooms, MD  07/07/2016 1:13 PM    [facility] [place], [place], [place]  [address] Phone: [phone]; Fax: [phone]

## 2016-07-07 NOTE — Patient Instructions (Signed)
**Note De-Identified Kerri Mccoy Obfuscation** Medication Instructions:  Same-no changes  Labwork: None  Testing/Procedures: Your physician has requested that you have an exercise tolerance test. For further information please visit [email or web address]. Please also follow instruction sheet, as given.   Follow-Up: Based on results     If you need a refill on your cardiac medications before your next appointment, please call your pharmacy.

## 2016-07-14 DIAGNOSIS — Q248 Other specified congenital malformations of heart: Secondary | ICD-10-CM | POA: Diagnosis not present

## 2016-07-14 DIAGNOSIS — R509 Fever, unspecified: Secondary | ICD-10-CM | POA: Diagnosis not present

## 2016-07-14 DIAGNOSIS — M791 Myalgia: Secondary | ICD-10-CM | POA: Diagnosis not present

## 2016-07-14 DIAGNOSIS — M329 Systemic lupus erythematosus, unspecified: Secondary | ICD-10-CM | POA: Diagnosis not present

## 2016-07-16 ENCOUNTER — Other Ambulatory Visit: Payer: Self-pay | Admitting: Family Medicine

## 2016-07-16 DIAGNOSIS — K869 Disease of pancreas, unspecified: Secondary | ICD-10-CM

## 2016-07-21 ENCOUNTER — Ambulatory Visit (INDEPENDENT_AMBULATORY_CARE_PROVIDER_SITE_OTHER): Payer: PPO

## 2016-07-21 DIAGNOSIS — R0602 Shortness of breath: Secondary | ICD-10-CM | POA: Diagnosis not present

## 2016-07-21 LAB — EXERCISE TOLERANCE TEST
CHL CUP MPHR: 158 {beats}/min
CSEPED: 6 min
CSEPHR: 102 %
CSEPPHR: 162 {beats}/min
Estimated workload: 7 METS
Exercise duration (sec): 0 s
RPE: 15
Rest HR: 64 {beats}/min

## 2016-07-28 ENCOUNTER — Ambulatory Visit (INDEPENDENT_AMBULATORY_CARE_PROVIDER_SITE_OTHER): Payer: PPO | Admitting: Neurology

## 2016-07-28 ENCOUNTER — Encounter: Payer: Self-pay | Admitting: Neurology

## 2016-07-28 VITALS — BP 116/72 | HR 80 | Ht 61.0 in | Wt 167.3 lb

## 2016-07-28 DIAGNOSIS — G40009 Localization-related (focal) (partial) idiopathic epilepsy and epileptic syndromes with seizures of localized onset, not intractable, without status epilepticus: Secondary | ICD-10-CM | POA: Diagnosis not present

## 2016-07-28 DIAGNOSIS — R413 Other amnesia: Secondary | ICD-10-CM | POA: Diagnosis not present

## 2016-07-28 MED ORDER — OXCARBAZEPINE 300 MG PO TABS: ORAL_TABLET | ORAL | 3 refills | Status: AC

## 2016-07-28 NOTE — Progress Notes (Signed)
NEUROLOGY FOLLOW UP OFFICE NOTE  Kassie Roehr [id number]  HISTORY OF PRESENT ILLNESS: I had the pleasure of seeing Kerri Mccoy in follow-up in the neurology clinic on 07/28/2016.  The patient was last seen a year ago for seizures and memory loss. She is again accompanied by her nephew who helps supplement the history today.  Since her last visit, they report one seizure 2 months ago. She recalls starting to feel a wave in her body and tried to go up to her room. She sat at the bottom of the stairs where family found her looking at them but not able to speak. No convulsive activity seen. They report this occurred during a stressful time when a family wedding was coming up. She was in the ER that same time for chest pain and diagnosed with anxiety. Her nephew also recalls her memory was worse during that time, now back to baseline. She reports episodes when she suddenly has difficulty speaking then feels tired afterward. This occurs multiple times a month, last episode was this morning in the car, she rolled her window down and the episode passed. Her nephew feels they occur when she is tired or did not eat. She states she ate breakfast this morning, but was sleep-deprived a few nights ago. She is on low dose oxcarbazepine 300mg  qhs with no side effects. She denies any headaches, dizziness, diplopia, focal numbness/tingling/weakness, no falls.   HPI: This is a pleasant 61 yo RH woman with a history of discoid lupus, rheumatoid arthritis, hyperlipidemia, hypertension, with a history of seizures and memory loss. She reports seizures started in 1969 when she was diagnosed with lupus. She denies any convulsive seizures. She describes her seizures as "phasing out," she would start feeling really strange, like something is traveling up her body, then she becomes unresponsive and stares. She needs to take a nap after, no focal weakness. She denies any olfactory/gustatory hallucinations or focal  paresthesias. No tongue bite or incontinence. She was started on an unrecalled seizure medication in 1972, and reports being switched to oxcarbazepine around 2 years ago. She felt drowsy on 300mg  BID and takes on 300mg  qhs. 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 Once she stopped working, she reports the seizures have decreased in frequency, she can go several months without seizures, last seizure was 3 months ago. She had been seeing neurologist Dr. Posey Pronto in [place], and previously another neurologist who told her that her EEG had shown seizure activity and switched her to oxcarbazepine. She reports having a brain scan, results unavailable for review. She started having body jerking around 1-1/2 yrs ago that mostly occurs when she gets surprised. There was significant improvement with starting Paroxetine by Dr. Posey Pronto almost a year ago. She had a few body jerks in the office today, initially more at the beginning and towards the end of the visit, none when distracted.   She started having memory changes a couple of years ago. She feels her memory is "pretty messed up," she would ask the same question repeatedly, would not recall where she put things or what she wore yesterday. She occasionally forgets her medications. All her bills are on autopay. She does not drive. Her nephew feels her memory is better than what people say it is, he does notice she repeats herself. He denies any personality changes, and reports she is much better overall since moving to [place] last October 2016. She was  prescribed donepezil 10mg  daily by Dr. Posey Pronto, no side effects.  She has occasional dizziness (lightheaded) that resolves upon sitting. She has some neck pain due to degenerative disc disease. She has occasional numbness in her fingers. She denies any headaches, diplopia, dysarthria, dysphagia, back pain, focal numbness/tingling/weakness, bowel/bladder dysfunction.  Her nephew reports an incident 2 months ago where "she just freaked and started screaming," he is unsure if this was a seizure but he denies any staring/unresponsive episodes.   Diagnostic Data: Her 24-hour EEG was normal, she had several body twitches, leg jerk, headache, with no associated epileptiform correlate.   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 There is no history of febrile convulsions, CNS infections such as meningitis/encephalitis, significant traumatic brain injury, neurosurgical procedures, or family history of seizures.  PAST MEDICAL HISTORY: Past Medical History:  Diagnosis Date  . Depression   . Heart valve disorder   . Hypercholesteremia   . Hypertension   . Lupus   . Rheumatoid arthritis ([place])   . Seizure [facility])     MEDICATIONS: Current Outpatient Prescriptions on File Prior to Visit  Medication Sig Dispense Refill  . Ascorbic Acid (VITAMIN C) 1000 MG tablet 1 tablet    . aspirin (BAYER LOW DOSE) 81 MG EC tablet 1 tablet    . benazepril (LOTENSIN) 20 MG tablet Take 20 mg by mouth daily.  3  . Calcium 500 MG tablet 1 tablet with meals    . Cholecalciferol (VITAMIN D-3) 1000 units CAPS Take 1,000 Units by mouth daily.    Marland Kitchen donepezil (ARICEPT) 10 MG tablet Take 10 mg by mouth at bedtime.    . ferrous sulfate 325 (65 FE) MG tablet Take 325 mg by mouth daily with breakfast.    . hydrochlorothiazide (HYDRODIURIL) 25 MG tablet Take 25 mg by mouth daily.    . hydroxychloroquine (PLAQUENIL) 200 MG tablet Take 200 mg by mouth 2 (two) times daily with a meal.  0  . ibuprofen (ADVIL,MOTRIN) 800 MG tablet Take 800 mg by mouth as needed.    Marland Kitchen LORazepam (ATIVAN) 0.5 MG tablet Take 1 tablet (0.5 mg total) by mouth every 8 (eight) hours as needed for anxiety. 30 tablet 0  . Oxcarbazepine (TRILEPTAL) 300 MG tablet Take 300 mg by mouth 2 (two) times daily.   3  . PARoxetine (PAXIL) 10 MG tablet TAKE 1  TABLET IN THE MORNING ONCE A DAY ORALLY 30 DAYS  5  . simvastatin (ZOCOR) 20 MG tablet Take 20 mg by mouth every evening.  3   No current facility-administered medications on file prior to visit.     ALLERGIES: Allergies  Allergen Reactions  . Codeine Rash  . Penicillins Rash    FAMILY HISTORY: Family History  Problem Relation Age of Onset  . Heart disease Father     SOCIAL HISTORY: Social History   Social History  . Marital status: Divorced    Spouse name: N/A  . Number of children: N/A  . Years of education: N/A   Occupational History  . Not on file.   Social History Main Topics  . Smoking status: Never Smoker  . Smokeless tobacco: Never Used  . Alcohol use 0.0 oz/week     Comment: once every 4 months  . Drug use: No  . Sexual activity: Not on file   Other Topics Concern  . Not on file   Social History Narrative  .  No narrative on file    REVIEW OF SYSTEMS: Constitutional: No fevers, chills, or sweats, no generalized fatigue, change in appetite Eyes: No visual changes, double vision, eye pain Ear, nose and throat: No hearing loss, ear pain, nasal congestion, sore throat Cardiovascular: No chest pain, palpitations Respiratory:  No shortness of breath at rest or with exertion, wheezes GastrointestinaI: No nausea, vomiting, diarrhea, abdominal pain, fecal incontinence Genitourinary:  No dysuria, urinary retention or frequency Musculoskeletal:  No neck pain, back pain Integumentary: No rash, pruritus, skin lesions Neurological: as above Psychiatric: No depression, insomnia, anxiety Endocrine: No palpitations, fatigue, diaphoresis, mood swings, change in appetite, change in weight, increased thirst Hematologic/Lymphatic:  No anemia, purpura, petechiae. Allergic/Immunologic: no itchy/runny eyes, nasal congestion, recent allergic reactions, rashes  PHYSICAL EXAM: Vitals:   07/28/16 1126  BP: 116/72  Pulse: 80   General: No acute distress Head:   Normocephalic/atraumatic Neck: supple, no paraspinal tenderness, full range of motion Heart:  Regular rate and rhythm Lungs:  Clear to auscultation bilaterally Back: No paraspinal tenderness Skin/Extremities: No rash, no edema Neurological Exam: alert and oriented to person, place, and time. No aphasia or dysarthria. Fund of knowledge is appropriate.  Recent and remote memory are intact. 3/3 delayed recall.  Attention and concentration are normal.    Able to name objects and repeat phrases. Cranial nerves: Pupils equal, round, reactive to light. Extraocular movements intact with no nystagmus. Visual fields full. Facial sensation intact. No facial asymmetry. Tongue, uvula, palate midline.  Motor: Bulk and tone normal, muscle strength 5/5 throughout with no pronator drift.  Sensation to light touch intact.  No extinction to double simultaneous stimulation.  Deep tendon reflexes 2+ throughout, toes downgoing.  Finger to nose testing intact.  Gait narrow-based and steady, able to tandem walk adequately.  Romberg negative.  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 She reports one seizure in the past year, but also reports a different recurrent symptoms where she feels she cannot talk for a few minutes, different from her typical seizures. She will keep a calendar of these and try to identify triggers, her nephew feels they occur when she is tired or hungry. We may consider slightly increasing oxcarbazepine on her next visit, continue 300mg  qhs for now. She We again discussed effects of stress/anxiety, she declines psychotherapy at this time. She can discontinue donepezil, no indication of dementia. She does not drive and is aware of [place] driving laws to stop driving after a seizure, until 6 months seizure-free. She will follow-up in 4 months and  knows to call for any changes  Thank you for allowing me to participate in her care.  Please do not hesitate to call for any questions or concerns.  The duration of this appointment visit was 25 minutes of face-to-face time with the patient.  Greater than 50% of this time was spent in counseling, explanation of diagnosis, planning of further management, and coordination of care.   Ellouise Newer, M.D.   CC: Dr. Justin Mend

## 2016-07-28 NOTE — Patient Instructions (Signed)
1. Continue oxcarbazepine 300mg  at night 2. Keep a calendar of your symptoms 3. Stop donepezil 4. Follow-up in 4 months, call for any changes

## 2016-07-30 ENCOUNTER — Ambulatory Visit
Admission: RE | Admit: 2016-07-30 | Discharge: 2016-07-30 | Disposition: A | Discharge status: Home / Self Care | Payer: PPO | Source: Ambulatory Visit | Attending: Family Medicine | Admitting: Family Medicine

## 2016-07-30 DIAGNOSIS — K869 Disease of pancreas, unspecified: Secondary | ICD-10-CM

## 2016-07-30 DIAGNOSIS — K7689 Other specified diseases of liver: Secondary | ICD-10-CM | POA: Diagnosis not present

## 2016-07-30 MED ORDER — GADOBENATE DIMEGLUMINE 529 MG/ML IV SOLN
13.0000 mL | Freq: Once | INTRAVENOUS | Status: AC | PRN
  Administered 2016-07-30: 13 mL via INTRAVENOUS

## 2016-08-07 DIAGNOSIS — R202 Paresthesia of skin: Secondary | ICD-10-CM | POA: Diagnosis not present

## 2016-08-12 ENCOUNTER — Other Ambulatory Visit: Payer: Self-pay | Admitting: Family Medicine

## 2016-08-12 DIAGNOSIS — G459 Transient cerebral ischemic attack, unspecified: Secondary | ICD-10-CM

## 2016-08-14 ENCOUNTER — Encounter (INDEPENDENT_AMBULATORY_CARE_PROVIDER_SITE_OTHER): Payer: Self-pay

## 2016-08-14 ENCOUNTER — Ambulatory Visit (INDEPENDENT_AMBULATORY_CARE_PROVIDER_SITE_OTHER): Payer: PPO | Admitting: Neurology

## 2016-08-14 DIAGNOSIS — R202 Paresthesia of skin: Secondary | ICD-10-CM | POA: Diagnosis not present

## 2016-08-14 DIAGNOSIS — Z0289 Encounter for other administrative examinations: Secondary | ICD-10-CM

## 2016-08-14 NOTE — Procedures (Signed)
Full Name: Kerri Mccoy Gender: Female MRN #: [id number] Date of Birth: [birth year]    Visit Date: 08/14/2016 07:54 Age: 61 Years 2 Months Old Examining Physician: Marcial Pacas, MD  Referring Physician: Maurice Small, MD History: 61 years old right-handed female with intermittent right hand paresthesia for 2 years.  On examination: Bilateral upper and lower extremities motor strength was normal. Deep tendon reflexes were normal and symmetric.  Summary of Tests:  Nerve conduction study: Right median, ulnar, radial sensory responses were normal. Right median, ulnar motor responses were normal.  Electromyography: Selected needle examination of right upper extremity and the right cervical paraspinals were normal.   Conclusion: This is a normal study, there is no electrodiagnostic evidence of right upper extremity neuropathy or right cervical radiculopathy.    ------------------------------- Marcial Pacas, M.D.  [facility] [place], [place] [address] Tel: [phone] Fax: [phone]        [facility]    Nerve / Sites Rec. Site Onset Lat Peak Lat NP Amp PP Amp Segments Distance Velocity    ms ms V V  cm m/s  R Median - Orthodromic (Dig II, Mid palm)     Dig II Wrist 2.81 3.39 27.4 33.6 Dig II - Wrist 13 46  R Ulnar - Orthodromic, (Dig V, Mid palm)     Dig V Wrist 1.98 2.50 12.3 14.8 Dig V - Wrist 11 56         SNC    Nerve / Sites Rec. Site Peak Lat Ref.  Amp Ref. Segments Distance    ms ms V V  cm  R Radial - Anatomical snuff box (Forearm)     Forearm Wrist 2.5 ?2.9 19 ?15 Forearm - Wrist 10       MNC    Nerve / Sites Muscle Latency Ref. Amplitude Ref. Rel Amp Segments Distance Lat Diff Velocity Ref. Area    ms ms mV mV %  cm ms m/s m/s mVms  R Median - APB     Wrist APB 3.9 ?4.4 9.5 ?4.0 100 Wrist - APB 7    32.6     Upper arm APB 7.4  9.6  100 Upper arm - Wrist 22 3.5 63  32.9  R Ulnar - ADM     Wrist ADM 2.7 ?3.3 13.8 ?6.0 100  Wrist - ADM 7    48.6     B.Elbow ADM 5.7  13.2  95.2 B.Elbow - Wrist 17 3.0 57 ?49 46.1     A.Elbow ADM 7.7  13.0  98.7 A.Elbow - B.Elbow 11 2.0 56 ?49 46.0         A.Elbow - Wrist  4.9            F  Wave    Nerve F Lat Ref.   ms ms  R Median - APB 25.9 ?31.0  R Ulnar - ADM 25.5 ?32.0         EMG full       EMG Summary Table    Spontaneous MUAP Recruitment  Muscle IA Fib PSW Fasc Other Amp Dur. Poly Pattern  R. Pronator teres Normal None None None _______ Normal Normal Normal Normal  R. First dorsal interosseous Normal None None None _______ Normal Normal Normal Normal  R. Extensor digitorum communis Normal None None None _______ Normal Normal Normal Normal  R. Deltoid Normal None None None _______ Normal Normal Normal Normal  R. Biceps brachii Normal None None  None _______ Normal Normal Normal Normal  R. Triceps brachii Normal None None None _______ Normal Normal Normal Normal  R. Cervical paraspinals Normal None None None _______ Normal Normal Normal Normal

## 2016-08-17 ENCOUNTER — Other Ambulatory Visit: Payer: Self-pay | Admitting: Family Medicine

## 2016-08-17 DIAGNOSIS — R202 Paresthesia of skin: Secondary | ICD-10-CM

## 2016-08-26 ENCOUNTER — Other Ambulatory Visit: Payer: PPO

## 2016-08-28 ENCOUNTER — Ambulatory Visit
Admission: RE | Admit: 2016-08-28 | Discharge: 2016-08-28 | Disposition: A | Discharge status: Home / Self Care | Payer: PPO | Source: Ambulatory Visit | Attending: Family Medicine | Admitting: Family Medicine

## 2016-08-28 DIAGNOSIS — R202 Paresthesia of skin: Secondary | ICD-10-CM

## 2016-08-28 DIAGNOSIS — R200 Anesthesia of skin: Secondary | ICD-10-CM | POA: Diagnosis not present

## 2016-08-28 MED ORDER — GADOBENATE DIMEGLUMINE 529 MG/ML IV SOLN
15.0000 mL | Freq: Once | INTRAVENOUS | Status: AC | PRN
  Administered 2016-08-28: 15 mL via INTRAVENOUS

## 2016-09-01 DIAGNOSIS — M329 Systemic lupus erythematosus, unspecified: Secondary | ICD-10-CM | POA: Diagnosis not present

## 2016-09-01 DIAGNOSIS — Z79899 Other long term (current) drug therapy: Secondary | ICD-10-CM | POA: Diagnosis not present

## 2016-09-01 DIAGNOSIS — M0579 Rheumatoid arthritis with rheumatoid factor of multiple sites without organ or systems involvement: Secondary | ICD-10-CM | POA: Diagnosis not present

## 2016-09-01 DIAGNOSIS — Z6830 Body mass index (BMI) 30.0-30.9, adult: Secondary | ICD-10-CM | POA: Diagnosis not present

## 2016-09-01 DIAGNOSIS — E669 Obesity, unspecified: Secondary | ICD-10-CM | POA: Diagnosis not present

## 2016-09-01 DIAGNOSIS — M2550 Pain in unspecified joint: Secondary | ICD-10-CM | POA: Diagnosis not present

## 2016-09-03 ENCOUNTER — Ambulatory Visit
Admission: RE | Admit: 2016-09-03 | Discharge: 2016-09-03 | Disposition: A | Discharge status: Home / Self Care | Payer: PPO | Source: Ambulatory Visit | Attending: Family Medicine | Admitting: Family Medicine

## 2016-09-03 DIAGNOSIS — G459 Transient cerebral ischemic attack, unspecified: Secondary | ICD-10-CM

## 2016-09-03 DIAGNOSIS — I6523 Occlusion and stenosis of bilateral carotid arteries: Secondary | ICD-10-CM | POA: Diagnosis not present

## 2016-09-28 DIAGNOSIS — M329 Systemic lupus erythematosus, unspecified: Secondary | ICD-10-CM | POA: Diagnosis not present

## 2016-11-24 ENCOUNTER — Ambulatory Visit: Payer: PPO | Admitting: Neurology

## 2017-04-14 ENCOUNTER — Ambulatory Visit: Payer: PPO | Admitting: Neurology

## 2017-07-12 IMAGING — MR MR HEAD WO/W CM
11 series · 48 of 48 positions shown · IV contrast (multihance)
Comparison: None.

CLINICAL DATA: Numbness of the right arm when raised over head.
Visual blurring. History of seizures.

EXAM:
MRI HEAD WITHOUT AND WITH CONTRAST
TECHNIQUE: Multiplanar, multiecho pulse sequences of the brain and surrounding
structures were obtained without and with intravenous contrast.
CONTRAST:  15mL MULTIHANCE GADOBENATE DIMEGLUMINE 529 MG/ML IV SOLN

[Series 2: T1 · sagittal · 5.0mm · 0.45mm/px · 1 of 16 slices shown]
[im 1/16]
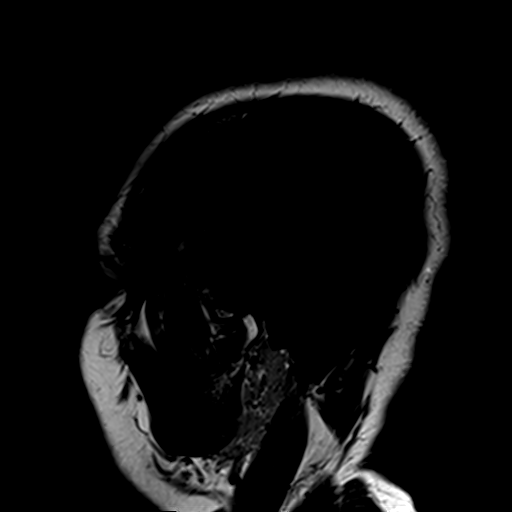

[Series 3: DWI · axial · 3.0mm · 1.80mm/px · z∈[-53,+80]mm · 6 of 92 slices shown (1 of 2)]
[im 1/92]
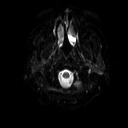
[im 19/92]
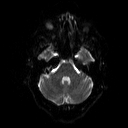
[im 37/92]
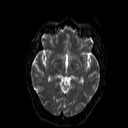
[im 55/92]
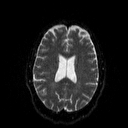
[im 73/92]
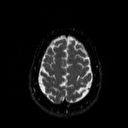
[im 92/92]
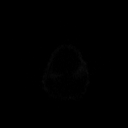

[Series 4: DWI · axial · 3.0mm · 1.80mm/px · z∈[-53,+80]mm · 3 of 45 slices shown (2 of 2)]
[im 1/45]
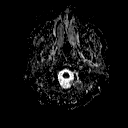
[im 23/45]
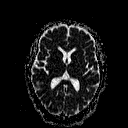
[im 45/45]
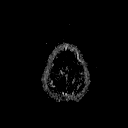

[Series 5: T2 · axial · 5.0mm · 0.51mm/px · 1 of 20 slices shown (1 of 2)]
[im 1/20]
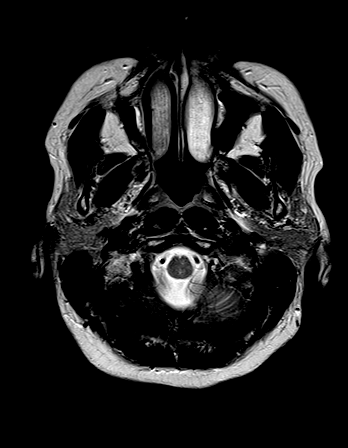

[Series 6: FLAIR · axial · 3.0mm · 0.45mm/px · z∈[-61,+78]mm · 3 of 48 slices shown]
[im 1/48]
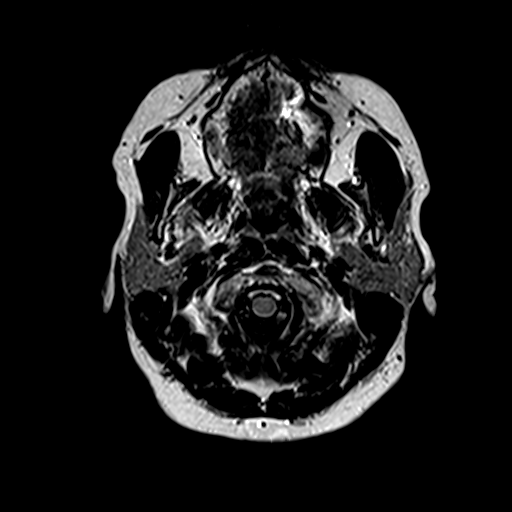
[im 24/48]
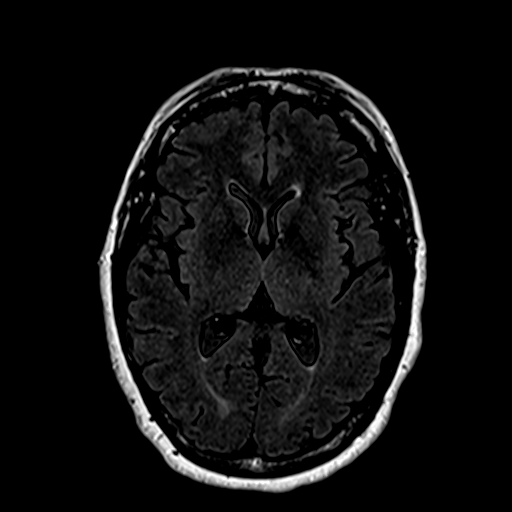
[im 48/48]
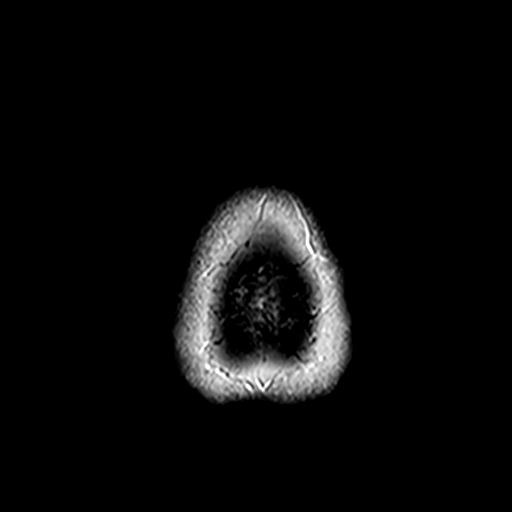

[Series 7: mip_images(sw) · axial · 16.0mm · 0.90mm/px · z∈[-54,+71]mm · 5 of 65 slices shown]
[im 1/65]
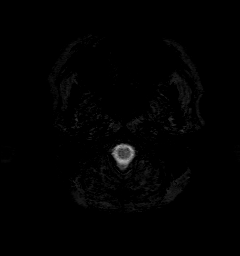
[im 17/65]
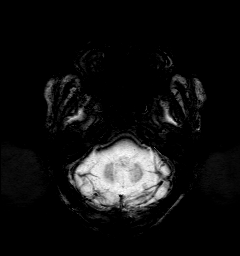
[im 33/65]
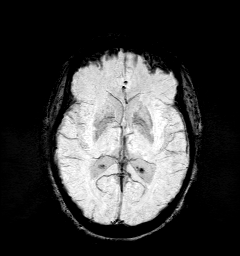
[im 49/65]
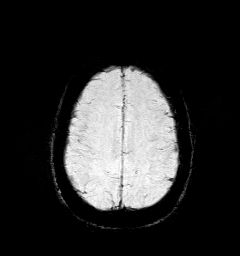
[im 65/65]
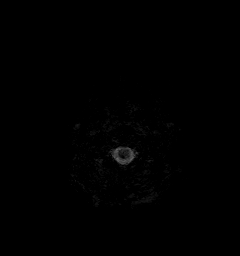

[Series 8: swi_images · axial · 2.0mm · 0.90mm/px · z∈[-61,+78]mm · 5 of 72 slices shown]
[im 1/72]
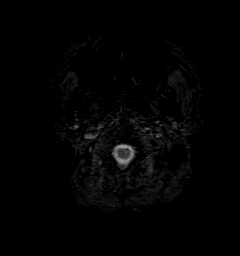
[im 18/72]
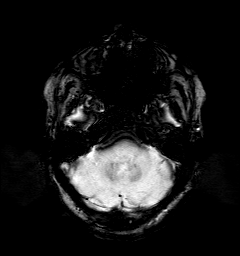
[im 36/72]
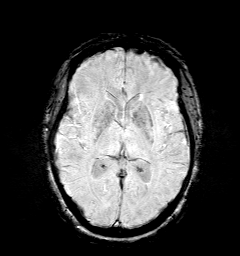
[im 54/72]
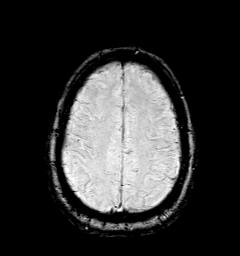
[im 72/72]
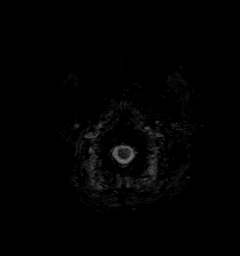

[Series 9: t1_mpr_tra · axial · 1.0mm · 0.75mm/px · z∈[-62,+79]mm · 10 of 144 slices shown (1 of 2)]
[im 1/144]
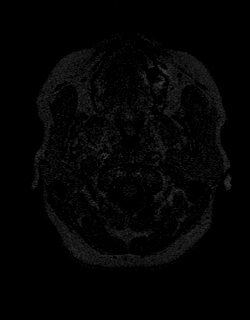
[im 16/144]
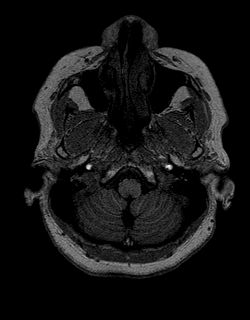
[im 32/144]
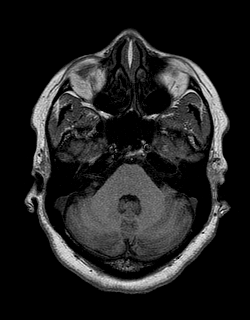
[im 48/144]
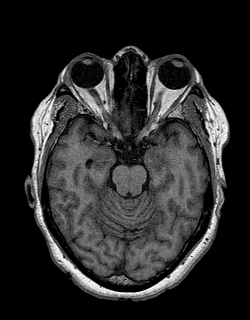
[im 64/144]
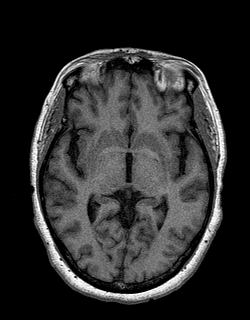
[im 80/144]
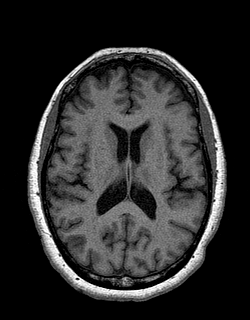
[im 96/144]
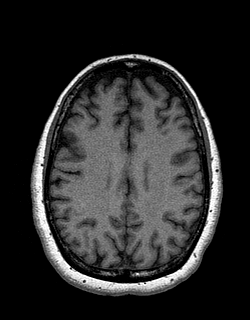
[im 112/144]
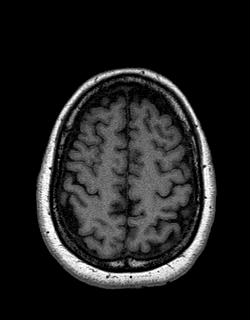
[im 128/144]
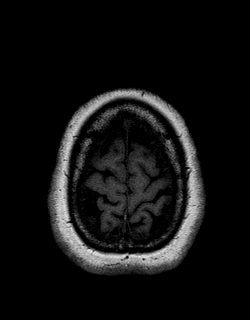
[im 144/144]
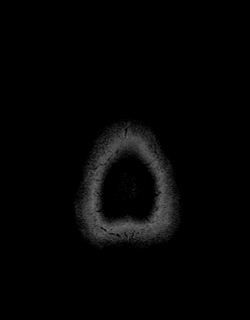

[Series 10: T2 · coronal · 5.0mm · 0.45mm/px · 2 of 23 slices shown (2 of 2)]
[im 1/23]
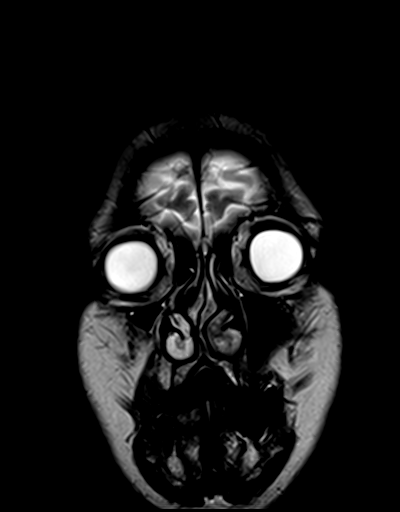
[im 23/23]
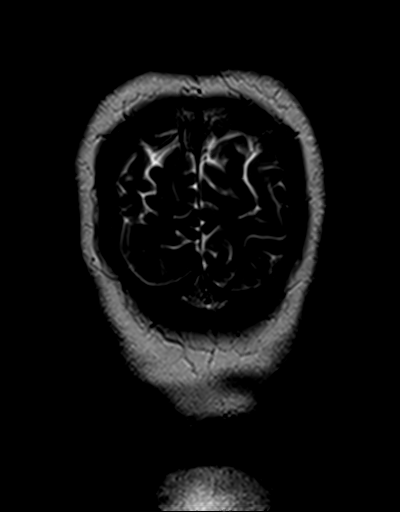

[Series 11: t1_mpr_tra · axial · 1.0mm · 0.75mm/px · z∈[-62,+79]mm · 10 of 144 slices shown (2 of 2)]
[im 1/144]
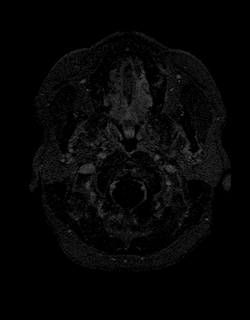
[im 16/144]
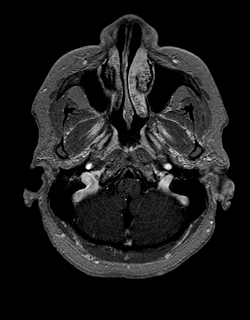
[im 32/144]
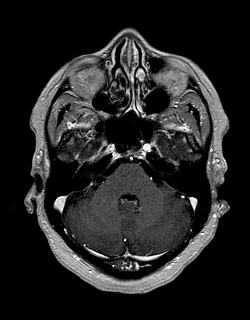
[im 48/144]
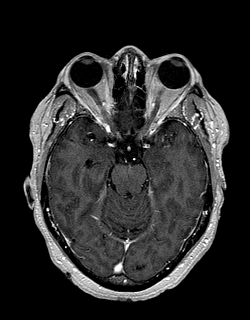
[im 64/144]
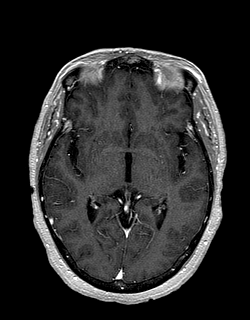
[im 80/144]
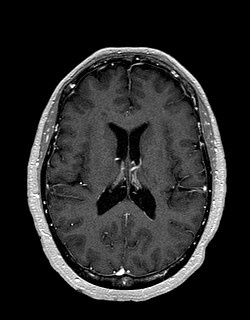
[im 96/144]
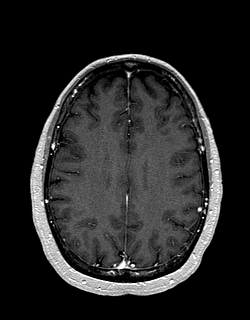
[im 112/144]
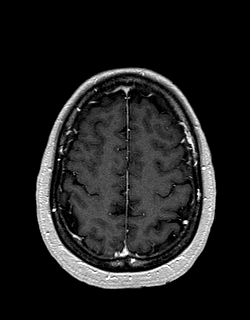
[im 128/144]
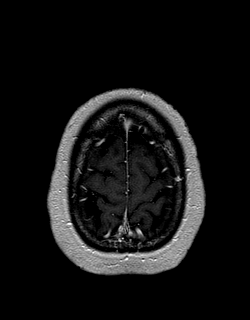
[im 144/144]
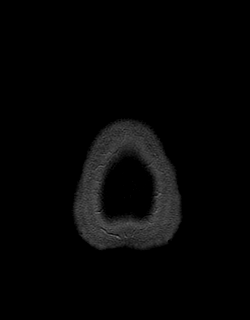

[Series 12: post cor · coronal · 5.0mm · 0.45mm/px · 2 of 23 slices shown]
[im 1/23]
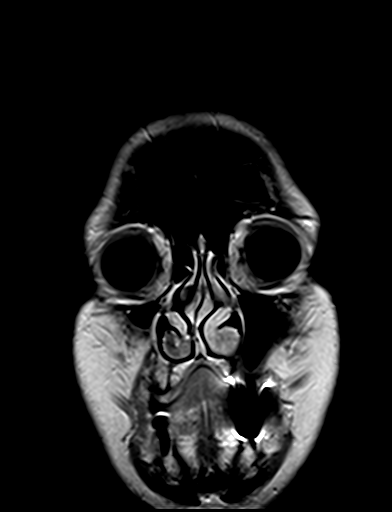
[im 23/23]
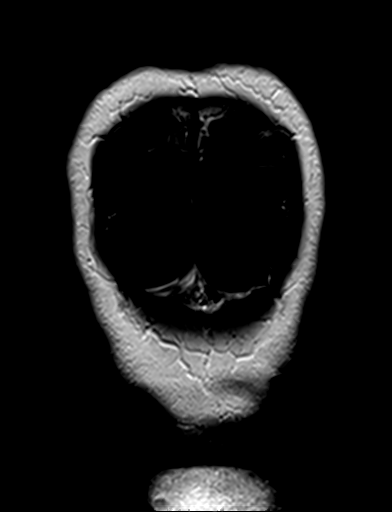

[48 of 48 positions shown; findings below may reference images not displayed]

FINDINGS: Brain: Diffusion imaging does not show any acute insult. The
brainstem and cerebellum are normal. Cerebral hemispheres are
normal. No evidence of infarction, mass lesion, hemorrhage,
hydrocephalus or extra-axial collection. After contrast
administration, no abnormal enhancement occurs. Mesial temporal
lobes appear normal and symmetric.

Vascular: Major vessels at the base of the brain show flow.

Skull and upper cervical spine: Normal

Sinuses/Orbits: Clear/normal

Other: None
IMPRESSION: Normal examination. No abnormality seen to explain the presenting
symptoms.

## 2018-04-24 IMAGING — US US CAROTID DUPLEX BILAT
1 series · 13 of 24 positions shown · non-contrast
Comparison: None.

CLINICAL DATA: TIA.  Several episodes of right arm numbness.

EXAM:
BILATERAL CAROTID DUPLEX ULTRASOUND
TECHNIQUE: Gray scale imaging, color Doppler and duplex ultrasound were
performed of bilateral carotid and vertebral arteries in the neck.

[Series 1: us carotid duplex bilat · 0.06mm/px · 13 of 57 slices shown]
[im 1/57]
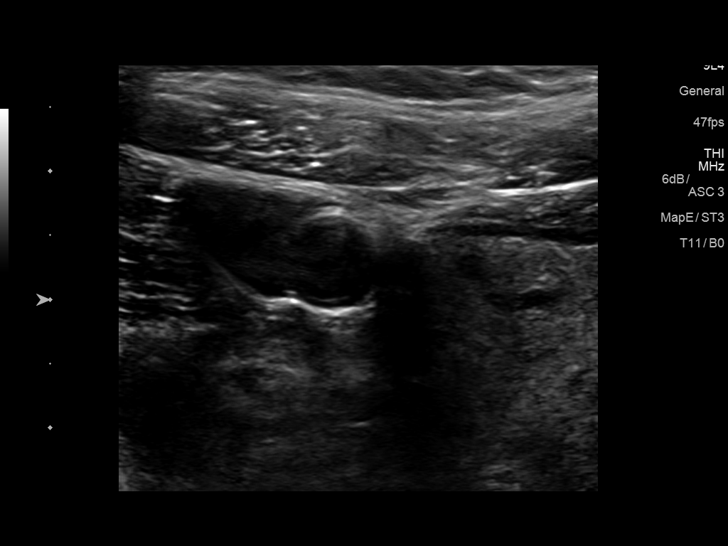
[im 5/57]
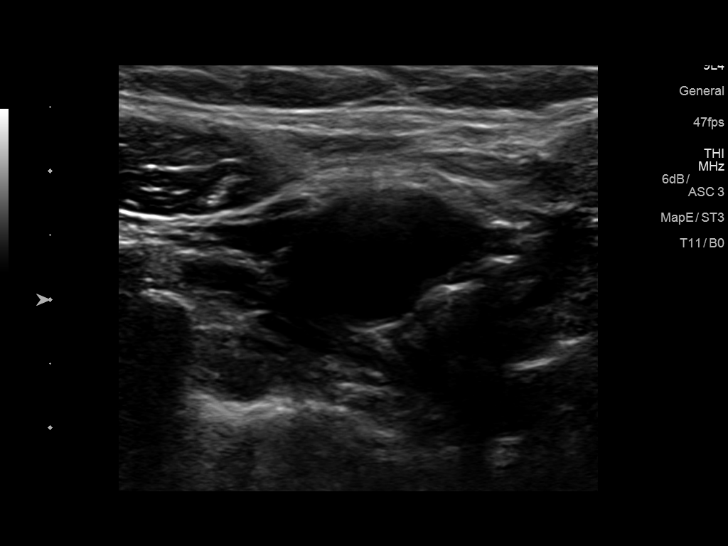
[im 10/57]
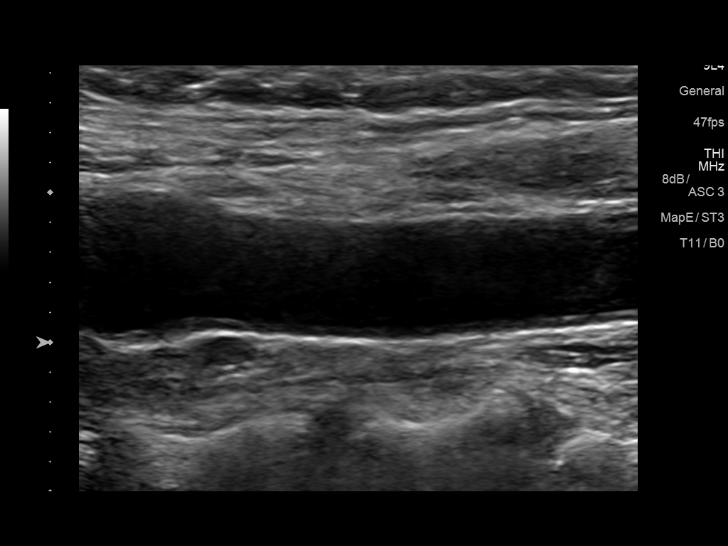
[im 15/57]
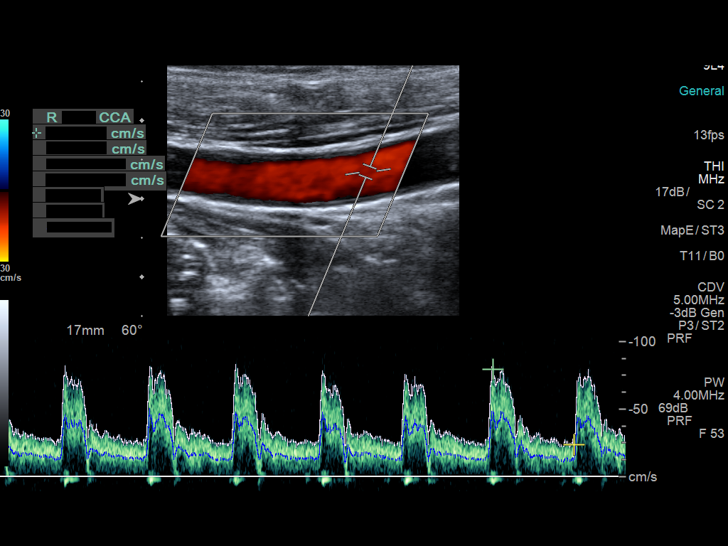
[im 20/57]
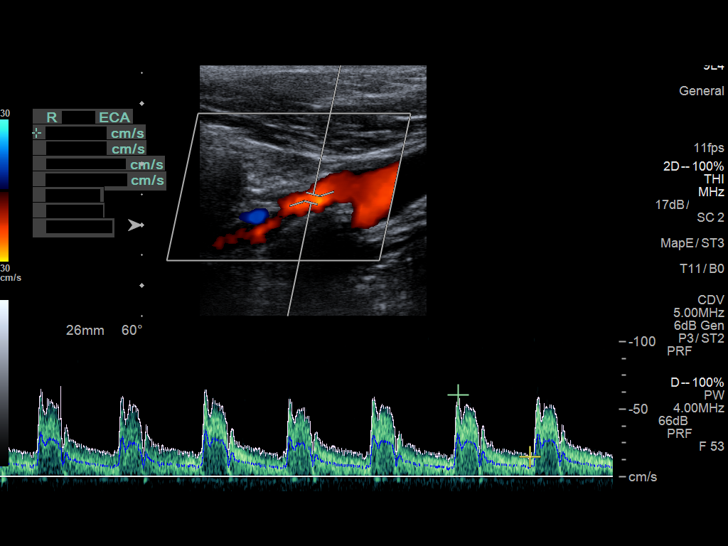
[im 25/57]
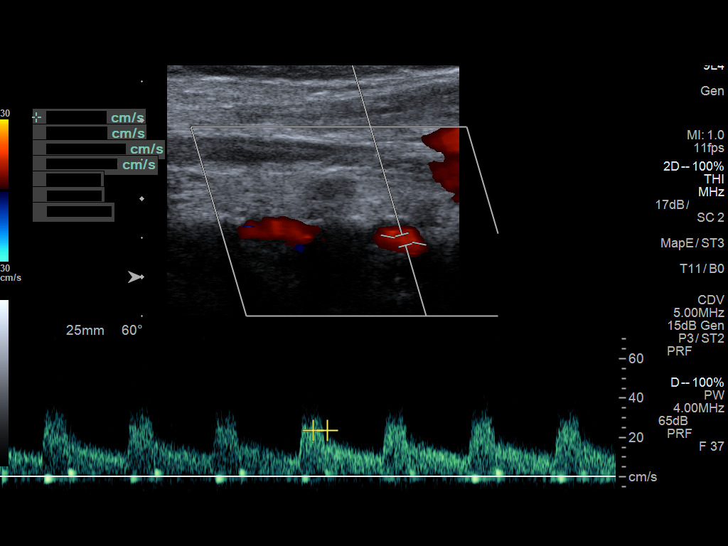
[im 30/57]
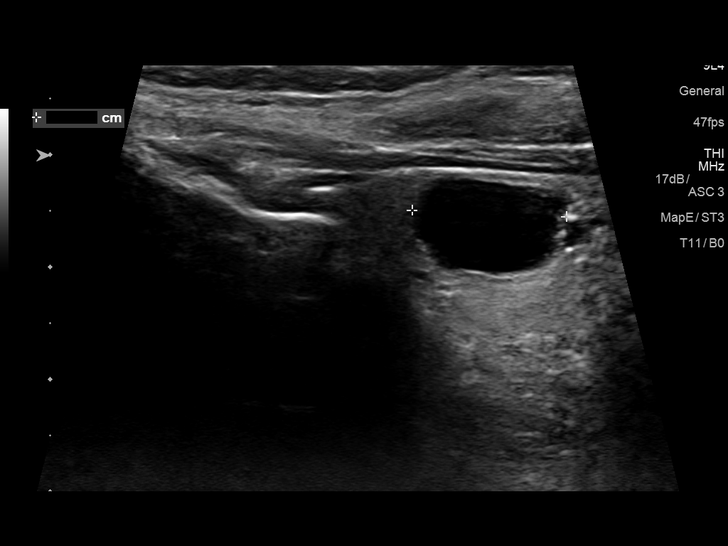
[im 32/57]
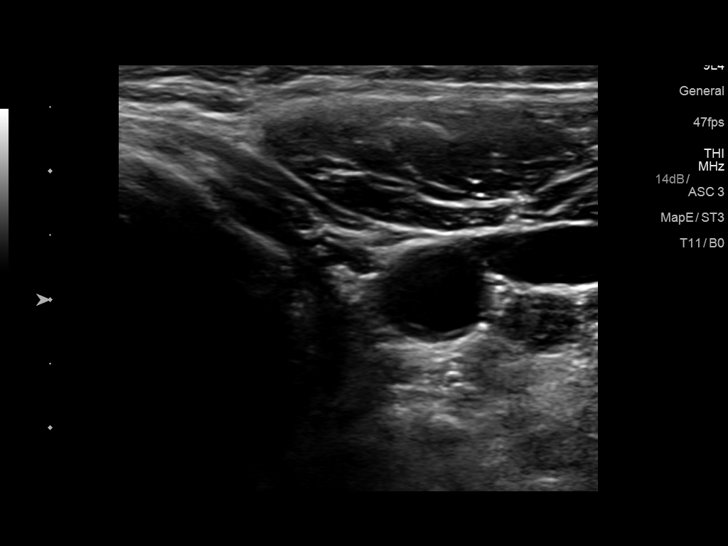
[im 37/57]
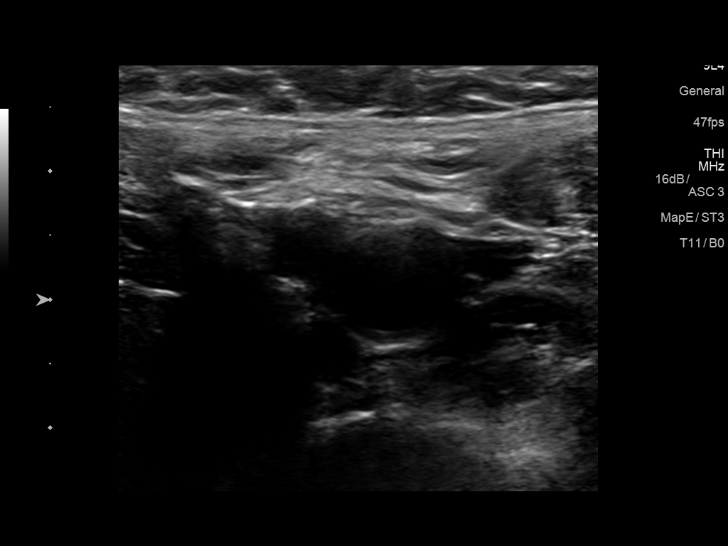
[im 42/57]
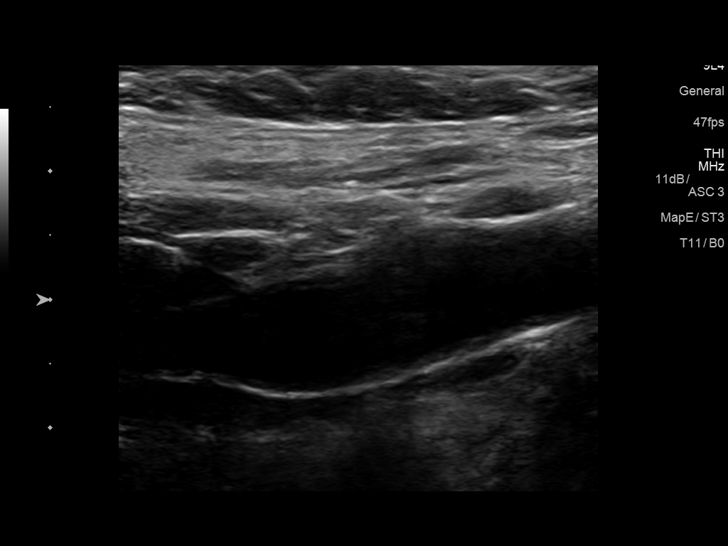
[im 47/57]
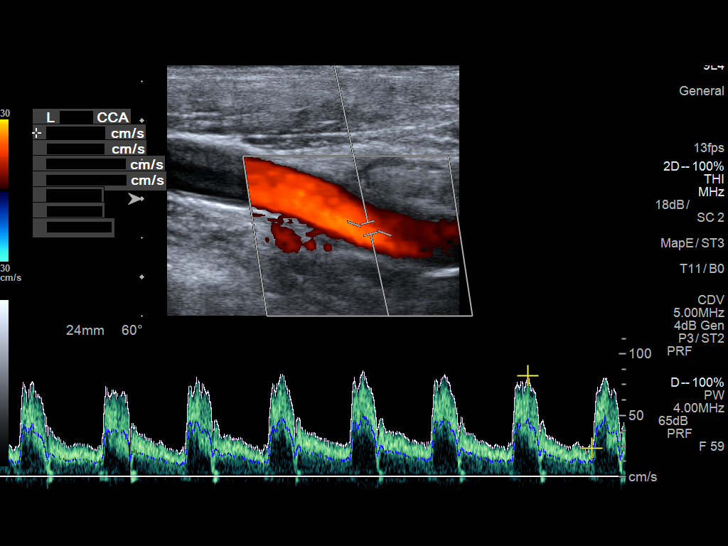
[im 52/57]
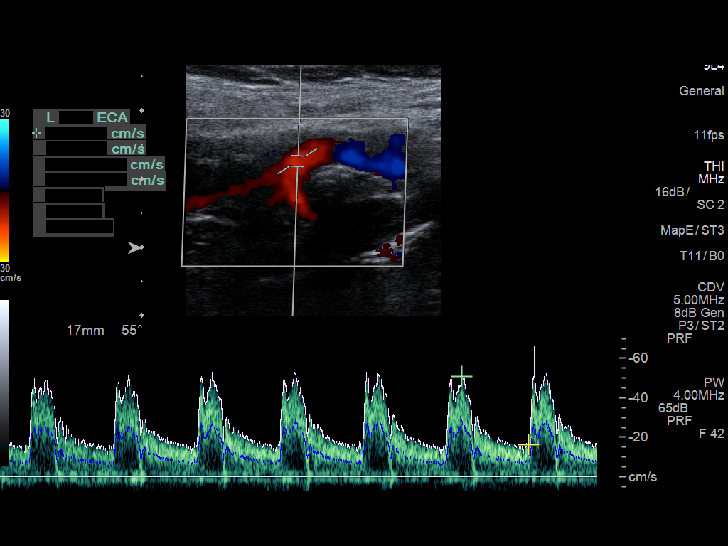
[im 57/57]
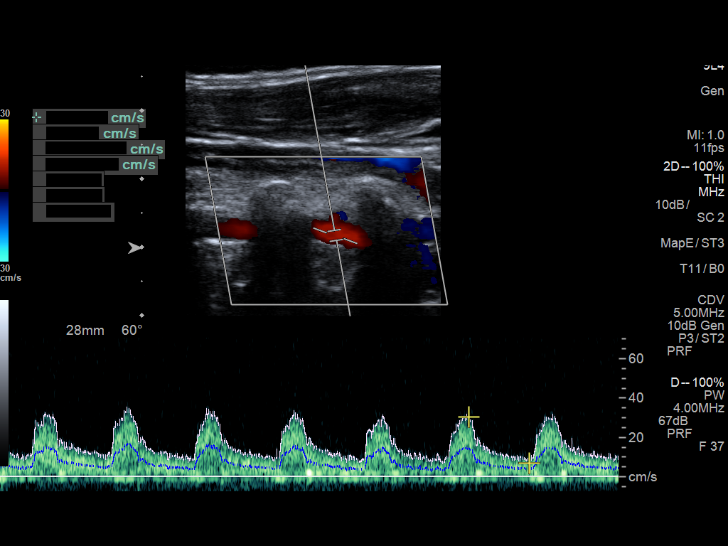

[13 of 24 positions shown; findings below may reference images not displayed]

FINDINGS: Criteria: Quantification of carotid stenosis is based on velocity
parameters that correlate the residual internal carotid diameter
with NASCET-based stenosis levels, using the diameter of the distal
internal carotid lumen as the denominator for stenosis measurement.

The following velocity measurements were obtained:

RIGHT

ICA:  69 cm/sec

CCA:  79 cm/sec

SYSTOLIC ICA/CCA RATIO:

DIASTOLIC ICA/CCA RATIO:

ECA:  61 cm/sec

LEFT

ICA:  73 cm/sec

CCA:  82 cm/sec

SYSTOLIC ICA/CCA RATIO:

DIASTOLIC ICA/CCA RATIO:

ECA:  51 cm/sec

RIGHT CAROTID ARTERY: Little if any plaque in the bulb. Low
resistance internal carotid Doppler pattern.

RIGHT VERTEBRAL ARTERY:  Antegrade.

LEFT CAROTID ARTERY: Little if any plaque in the bulb. Low
resistance internal carotid Doppler pattern.

LEFT VERTEBRAL ARTERY:  Antegrade.

There is a benign-appearing cyst in the right lobe of the thyroid
gland measuring up to 1.4 cm.
IMPRESSION: Less than 50% stenosis in the right and left internal carotid
arteries.
